# Patient Record
Sex: Female | Born: 1990 | Race: White | Hispanic: No | Marital: Married | Smoking: Former smoker
Health system: Southern US, Community
[De-identification: ages and names within clinical notes are randomized; demographics above are authoritative.]

## PROBLEM LIST (undated history)

## (undated) ENCOUNTER — Emergency Department (HOSPITAL_COMMUNITY): Discharge status: Against Medical Advice | Payer: PRIVATE HEALTH INSURANCE

## (undated) DIAGNOSIS — E282 Polycystic ovarian syndrome: Principal | ICD-10-CM

## (undated) DIAGNOSIS — R1032 Left lower quadrant pain: Principal | ICD-10-CM

## (undated) DIAGNOSIS — N912 Amenorrhea, unspecified: Principal | ICD-10-CM

## (undated) DIAGNOSIS — J45909 Unspecified asthma, uncomplicated: Secondary | ICD-10-CM

## (undated) DIAGNOSIS — Z319 Encounter for procreative management, unspecified: Principal | ICD-10-CM

## (undated) HISTORY — DX: Left lower quadrant pain: R10.32

## (undated) HISTORY — DX: Encounter for procreative management, unspecified: Z31.9

## (undated) HISTORY — PX: NO PAST SURGERIES: SHX2092

## (undated) HISTORY — DX: Unspecified asthma, uncomplicated: J45.909

## (undated) HISTORY — DX: Amenorrhea, unspecified: N91.2

## (undated) HISTORY — DX: Polycystic ovarian syndrome: E28.2

---
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
## 2011-08-28 ENCOUNTER — Emergency Department: Payer: Self-pay | Admitting: Emergency Medicine

## 2012-08-17 ENCOUNTER — Emergency Department (HOSPITAL_COMMUNITY)
Admission: EM | Admit: 2012-08-17 | Discharge: 2012-08-17 | Disposition: A | Discharge status: Home / Self Care | Payer: Self-pay | Attending: Emergency Medicine | Admitting: Emergency Medicine

## 2012-08-17 ENCOUNTER — Emergency Department (HOSPITAL_COMMUNITY): Payer: Self-pay

## 2012-08-17 ENCOUNTER — Encounter (HOSPITAL_COMMUNITY): Payer: Self-pay | Admitting: Emergency Medicine

## 2012-08-17 DIAGNOSIS — R109 Unspecified abdominal pain: Secondary | ICD-10-CM | POA: Insufficient documentation

## 2012-08-17 DIAGNOSIS — R05 Cough: Secondary | ICD-10-CM | POA: Insufficient documentation

## 2012-08-17 DIAGNOSIS — F17200 Nicotine dependence, unspecified, uncomplicated: Secondary | ICD-10-CM | POA: Insufficient documentation

## 2012-08-17 DIAGNOSIS — R079 Chest pain, unspecified: Secondary | ICD-10-CM | POA: Insufficient documentation

## 2012-08-17 DIAGNOSIS — R042 Hemoptysis: Secondary | ICD-10-CM | POA: Insufficient documentation

## 2012-08-17 DIAGNOSIS — R059 Cough, unspecified: Secondary | ICD-10-CM | POA: Insufficient documentation

## 2012-08-17 DIAGNOSIS — J40 Bronchitis, not specified as acute or chronic: Secondary | ICD-10-CM

## 2012-08-17 DIAGNOSIS — R062 Wheezing: Secondary | ICD-10-CM | POA: Insufficient documentation

## 2012-08-17 MED ORDER — IPRATROPIUM BROMIDE 0.02 % IN SOLN
0.5000 mg | Freq: Once | RESPIRATORY_TRACT | Status: AC
  Administered 2012-08-17: 0.5 mg via RESPIRATORY_TRACT
  Filled 2012-08-17: qty 2.5

## 2012-08-17 MED ORDER — ALBUTEROL SULFATE (5 MG/ML) 0.5% IN NEBU
2.5000 mg | INHALATION_SOLUTION | Freq: Once | RESPIRATORY_TRACT | Status: AC
  Administered 2012-08-17: 2.5 mg via RESPIRATORY_TRACT
  Filled 2012-08-17: qty 0.5

## 2012-08-17 MED ORDER — HYDROCOD POLST-CHLORPHEN POLST 10-8 MG/5ML PO LQCR: 5.0000 mL | Freq: Two times a day (BID) | ORAL | Status: DC | PRN

## 2012-08-17 MED ORDER — PREDNISONE 50 MG PO TABS
60.0000 mg | ORAL_TABLET | Freq: Once | ORAL | Status: AC
  Administered 2012-08-17: 60 mg via ORAL
  Filled 2012-08-17: qty 1

## 2012-08-17 MED ORDER — AMOXICILLIN 500 MG PO CAPS: 500.0000 mg | ORAL_CAPSULE | Freq: Three times a day (TID) | ORAL | Status: DC

## 2012-08-17 NOTE — ED Notes (Signed)
Pt c/o coughing up blood, chest pain and wheezing x one week.

## 2012-08-17 NOTE — ED Notes (Signed)
Pt discharged. Pt stable at time of discharge. Medications reviewed pt has no questions regarding discharge at this time. Pt voiced understanding of discharge instructions.  

## 2012-08-17 NOTE — ED Provider Notes (Signed)
History   This chart was scribed for Krystal Lennert, MD, by Frederik Pear, ER scribe. The patient was seen in room APA04/APA04 and the patient's care was started at 1715.    CSN: [id number]  Arrival date & time 08/17/12  1701   First MD Initiated Contact with Patient 08/17/12 1715      Chief Complaint  Patient presents with  . Cough  . Chest Pain  . Wheezing  . Hemoptysis    (Consider location/radiation/quality/duration/timing/severity/associated sxs/prior treatment) HPI Comments: Krystal Porter is a 21 y.o. female who presents to the Emergency Department complaining of moderate, constant coughing with associated wheezing and chest and abdominal pain that began a week ago. She states that she has coughed up bloody sputum twice. She has a h/o of childhood asthma.     Patient is a 21 y.o. female presenting with cough. The history is provided by the patient.  Cough This is a new problem. The current episode started more than 2 days ago. The problem occurs constantly. The problem has not changed since onset.The cough is productive of bloody sputum. She is not a smoker.    History reviewed. No pertinent past medical history.  History reviewed. No pertinent past surgical history.  History reviewed. No pertinent family history.  History  Substance Use Topics  . Smoking status: Current Some Day Smoker  . Smokeless tobacco: Not on file  . Alcohol Use: No    OB History    Grav Para Term Preterm Abortions TAB SAB Ect Mult Living                  Review of Systems  HENT: Negative for congestion, sinus pressure and ear discharge.   Eyes: Negative for discharge.  Gastrointestinal: Negative for diarrhea.  Genitourinary: Negative for frequency and hematuria.  Musculoskeletal: Negative for back pain.  Skin: Negative for rash.  Hematological: Negative.   Psychiatric/Behavioral: Negative for hallucinations.  All other systems reviewed and are negative.    Allergies  Review  of patient's allergies indicates no known allergies.  Home Medications  No current outpatient prescriptions on file.  BP 125/85  Pulse 118  Temp 98.4 F (36.9 C) (Oral)  Resp 20  Ht 5\' 3"  (1.6 m)  Wt 180 lb (81.647 kg)  BMI 31.89 kg/m2  SpO2 98%  LMP 07/16/2012  Physical Exam  Constitutional: She is oriented to person, place, and time. She appears well-developed.  HENT:  Head: Normocephalic and atraumatic.  Eyes: Conjunctivae normal and EOM are normal. No scleral icterus.  Neck: Neck supple. No thyromegaly present.  Cardiovascular: Normal rate and regular rhythm.  Exam reveals no gallop and no friction rub.   No murmur heard. Pulmonary/Chest: No stridor. She has wheezes. She has no rales. She exhibits no tenderness.       She has moderate wheezing bilaterally.  Abdominal: She exhibits no distension. There is no tenderness. There is no rebound.  Musculoskeletal: Normal range of motion. She exhibits no edema.  Lymphadenopathy:    She has no cervical adenopathy.  Neurological: She is oriented to person, place, and time. Coordination normal.  Skin: No rash noted. No erythema.  Psychiatric: She has a normal mood and affect. Her behavior is normal.    ED Course  Procedures (including critical care time)  DIAGNOSTIC STUDIES: Oxygen Saturation is 98% on room air, normal by my interpretation.    COORDINATION OF CARE:  17:18- Discussed planned course of treatment with the patient, including a breathing treatment and  chest X-ray, who is agreeable at this time.  17:30- albuterol (PROVENTIL) (5MG /ML) 0.5% nebulizer solution 2.5 mg-Once, ipratropium (ATROVENT) nebulizer solution 0.5 mg-Once, prednisone (DELTASONE) tablet 60 mg-Once.  18:56- Recheck- Her lungs sound better after the breathing treatment. Discussed that her symptoms are consistent with bronchitis and starting her on a course of antibiotics.  Labs Reviewed - No data to display Dg Chest 2 View  08/17/2012  *RADIOLOGY  REPORT*  Clinical Data: Cough.  Chest pain.  Wheezing.  CHEST - 2 VIEW  Comparison: None.  Findings:  Cardiopericardial silhouette within normal limits. Mediastinal contours normal. Trachea midline.  No airspace disease or effusion.  IMPRESSION: Negative two-view chest.   Original Report Authenticated By: Andreas Newport, M.D.      No diagnosis found.    MDM    The chart was scribed for me under my direct supervision.  I personally performed the history, physical, and medical decision making and all procedures in the evaluation of this patient.Krystal Lennert, MD 08/17/12 [phone]

## 2012-08-17 NOTE — ED Notes (Signed)
Paged RT for treatment

## 2012-10-10 ENCOUNTER — Emergency Department (HOSPITAL_COMMUNITY): Payer: Self-pay

## 2012-10-10 ENCOUNTER — Encounter (HOSPITAL_COMMUNITY): Payer: Self-pay | Admitting: Emergency Medicine

## 2012-10-10 ENCOUNTER — Emergency Department (HOSPITAL_COMMUNITY)
Admission: EM | Admit: 2012-10-10 | Discharge: 2012-10-10 | Disposition: A | Discharge status: Home / Self Care | Payer: Self-pay | Attending: Emergency Medicine | Admitting: Emergency Medicine

## 2012-10-10 DIAGNOSIS — F17200 Nicotine dependence, unspecified, uncomplicated: Secondary | ICD-10-CM | POA: Insufficient documentation

## 2012-10-10 DIAGNOSIS — R109 Unspecified abdominal pain: Secondary | ICD-10-CM | POA: Insufficient documentation

## 2012-10-10 LAB — URINALYSIS, ROUTINE W REFLEX MICROSCOPIC
Bilirubin Urine: NEGATIVE
Glucose, UA: NEGATIVE mg/dL
Ketones, ur: NEGATIVE mg/dL
Nitrite: NEGATIVE
Specific Gravity, Urine: >1.03 — ABNORMAL HIGH (ref 1.005–1.030)
pH: 5 (ref 5.0–8.0)

## 2012-10-10 LAB — COMPREHENSIVE METABOLIC PANEL
ALT: 35 U/L (ref 0–35)
AST: 25 U/L (ref 0–37)
Albumin: 4.2 g/dL (ref 3.5–5.2)
Alkaline Phosphatase: 83 U/L (ref 39–117)
Calcium: 9.9 mg/dL (ref 8.4–10.5)
Potassium: 3.8 mEq/L (ref 3.5–5.1)
Sodium: 136 mEq/L (ref 135–145)
Total Protein: 8.4 g/dL — ABNORMAL HIGH (ref 6.0–8.3)

## 2012-10-10 LAB — CBC WITH DIFFERENTIAL/PLATELET
Basophils Absolute: 0 10*3/uL (ref 0.0–0.1)
Basophils Relative: 0 % (ref 0–1)
Eosinophils Absolute: 0.1 10*3/uL (ref 0.0–0.7)
Eosinophils Relative: 1 % (ref 0–5)
Lymphs Abs: 1.5 10*3/uL (ref 0.7–4.0)
MCH: 28.9 pg (ref 26.0–34.0)
Neutrophils Relative %: 78 % — ABNORMAL HIGH (ref 43–77)
Platelets: 301 10*3/uL (ref 150–400)
RBC: 4.88 MIL/uL (ref 3.87–5.11)
RDW: 13 % (ref 11.5–15.5)

## 2012-10-10 LAB — WET PREP, GENITAL
Clue Cells Wet Prep HPF POC: NONE SEEN
Trich, Wet Prep: NONE SEEN
WBC, Wet Prep HPF POC: NONE SEEN

## 2012-10-10 LAB — PREGNANCY, URINE: Preg Test, Ur: NEGATIVE

## 2012-10-10 MED ORDER — AZITHROMYCIN 250 MG PO TABS
1000.0000 mg | ORAL_TABLET | Freq: Once | ORAL | Status: AC
  Administered 2012-10-10: 1000 mg via ORAL
  Filled 2012-10-10: qty 4

## 2012-10-10 MED ORDER — CEFTRIAXONE SODIUM 1 G IJ SOLR
1.0000 g | Freq: Once | INTRAMUSCULAR | Status: AC
  Administered 2012-10-10: 1 g via INTRAMUSCULAR
  Filled 2012-10-10: qty 10

## 2012-10-10 MED ORDER — HYDROMORPHONE HCL PF 1 MG/ML IJ SOLN
1.0000 mg | Freq: Once | INTRAMUSCULAR | Status: AC
  Administered 2012-10-10: 1 mg via INTRAVENOUS
  Filled 2012-10-10: qty 1

## 2012-10-10 MED ORDER — SODIUM CHLORIDE 0.9 % IV BOLUS (SEPSIS)
1000.0000 mL | Freq: Once | INTRAVENOUS | Status: AC
  Administered 2012-10-10: 1000 mL via INTRAVENOUS

## 2012-10-10 MED ORDER — LIDOCAINE HCL (PF) 1 % IJ SOLN
INTRAMUSCULAR | Status: AC
  Administered 2012-10-10: 2.1 mL
  Filled 2012-10-10: qty 5

## 2012-10-10 MED ORDER — DEXTROSE 5 % IV SOLN: 1.0000 g | Freq: Once | INTRAVENOUS | Status: DC

## 2012-10-10 MED ORDER — ONDANSETRON HCL 4 MG/2ML IJ SOLN
4.0000 mg | Freq: Once | INTRAMUSCULAR | Status: AC
  Administered 2012-10-10: 4 mg via INTRAVENOUS
  Filled 2012-10-10: qty 2

## 2012-10-10 MED ORDER — IOHEXOL 300 MG/ML  SOLN
100.0000 mL | Freq: Once | INTRAMUSCULAR | Status: AC | PRN
  Administered 2012-10-10: 100 mL via INTRAVENOUS

## 2012-10-10 MED ORDER — ONDANSETRON 8 MG PO TBDP
8.0000 mg | ORAL_TABLET | Freq: Once | ORAL | Status: AC
  Administered 2012-10-10: 8 mg via ORAL
  Filled 2012-10-10: qty 1

## 2012-10-10 NOTE — ED Notes (Signed)
Pt c/o abdominal pain and nausea x4 days. Pt states pain began in upper quadrants but now radiates to lower. Pain has gradually worsened. Denies V/D.

## 2012-10-10 NOTE — ED Provider Notes (Signed)
History     CSN: [id number]  Arrival date & time 10/10/12  1408   First MD Initiated Contact with Patient 10/10/12 1742      Chief Complaint  Patient presents with  . Abdominal Pain    (Consider location/radiation/quality/duration/timing/severity/associated sxs/prior treatment) HPI Patient complaining of pain in lower abdomen with pressure that began four days ago, comes and goes present all day today.  Pain is 9.5, did not treat at home. Nausea but no vomiting, took water only today.  Normal bowel movements, no uti symtpoms, no abnormal vaginal discharge.  LMP 8 months ago.  G0, patient is sexually active.  Patient had oral contraceptives from health department, states she took oral contraceptives for several months but quit because she had several clots but only for one day.   History reviewed. No pertinent past medical history.  History reviewed. No pertinent past surgical history.  History reviewed. No pertinent family history.  History  Substance Use Topics  . Smoking status: Current Some Day Smoker  . Smokeless tobacco: Not on file  . Alcohol Use: No    OB History    Grav Para Term Preterm Abortions TAB SAB Ect Mult Living                  Review of Systems  All other systems reviewed and are negative.    Allergies  Review of patient's allergies indicates no known allergies.  Home Medications   Current Outpatient Rx  Name  Route  Sig  Dispense  Refill  . AMOXICILLIN 500 MG PO CAPS   Oral   Take 1 capsule (500 mg total) by mouth 3 (three) times daily.   21 capsule   0   . HYDROCOD POLST-CPM POLST ER 10-8 MG/5ML PO LQCR   Oral   Take 5 mLs by mouth every 12 (twelve) hours as needed.   115 mL   0     BP 126/84  Pulse 114  Temp 98 F (36.7 C) (Oral)  Resp 18  SpO2 98%  Physical Exam  Nursing note and vitals reviewed. Constitutional: She appears well-developed and well-nourished.  HENT:  Head: Normocephalic and atraumatic.  Eyes: Conjunctivae  normal and EOM are normal. Pupils are equal, round, and reactive to light.  Neck: Normal range of motion. Neck supple.  Cardiovascular: Normal rate, regular rhythm, normal heart sounds and intact distal pulses.   Pulmonary/Chest: Effort normal and breath sounds normal.  Abdominal: Soft. Bowel sounds are normal. There is tenderness.       Diffuse abdominal tenderness  Genitourinary: Uterus normal. Cervix exhibits motion tenderness. Cervix exhibits no discharge and no friability. Right adnexum displays tenderness. Right adnexum displays no mass. Left adnexum displays tenderness. Left adnexum displays no mass. No vaginal discharge found.  Musculoskeletal: Normal range of motion.  Neurological: She is alert. She has normal reflexes.  Skin: Skin is warm and dry.  Psychiatric: She has a normal mood and affect. Her behavior is normal. Judgment and thought content normal.    ED Course  Procedures (including critical care time)  Labs Reviewed - No data to display No results found.   No diagnosis found.  Results for orders placed during the hospital encounter of 10/10/12  CBC WITH DIFFERENTIAL      Component Value Range   WBC 9.6  4.0 - 10.5 K/uL   RBC 4.88  3.87 - 5.11 MIL/uL   Hemoglobin 14.1  12.0 - 15.0 g/dL   HCT 40.9  81.1 - 91.4 %  MCV 86.7  78.0 - 100.0 fL   MCH 28.9  26.0 - 34.0 pg   MCHC 33.3  30.0 - 36.0 g/dL   RDW 16.1  09.6 - 04.5 %   Platelets 301  150 - 400 K/uL   Neutrophils Relative 78 (*) 43 - 77 %   Neutro Abs 7.5  1.7 - 7.7 K/uL   Lymphocytes Relative 16  12 - 46 %   Lymphs Abs 1.5  0.7 - 4.0 K/uL   Monocytes Relative 5  3 - 12 %   Monocytes Absolute 0.5  0.1 - 1.0 K/uL   Eosinophils Relative 1  0 - 5 %   Eosinophils Absolute 0.1  0.0 - 0.7 K/uL   Basophils Relative 0  0 - 1 %   Basophils Absolute 0.0  0.0 - 0.1 K/uL  COMPREHENSIVE METABOLIC PANEL      Component Value Range   Sodium 136  135 - 145 mEq/L   Potassium 3.8  3.5 - 5.1 mEq/L   Chloride 99  96 -  112 mEq/L   CO2 26  19 - 32 mEq/L   Glucose, Bld 91  70 - 99 mg/dL   BUN 9  6 - 23 mg/dL   Creatinine, Ser 4.09  0.50 - 1.10 mg/dL   Calcium 9.9  8.4 - 81.1 mg/dL   Total Protein 8.4 (*) 6.0 - 8.3 g/dL   Albumin 4.2  3.5 - 5.2 g/dL   AST 25  0 - 37 U/L   ALT 35  0 - 35 U/L   Alkaline Phosphatase 83  39 - 117 U/L   Total Bilirubin 0.3  0.3 - 1.2 mg/dL   GFR calc non Af Amer >90  >90 mL/min   GFR calc Af Amer >90  >90 mL/min  URINALYSIS, ROUTINE W REFLEX MICROSCOPIC      Component Value Range   Color, Urine YELLOW  YELLOW   APPearance CLEAR  CLEAR   Specific Gravity, Urine >1.030 (*) 1.005 - 1.030   pH 5.0  5.0 - 8.0   Glucose, UA NEGATIVE  NEGATIVE mg/dL   Hgb urine dipstick NEGATIVE  NEGATIVE   Bilirubin Urine NEGATIVE  NEGATIVE   Ketones, ur NEGATIVE  NEGATIVE mg/dL   Protein, ur NEGATIVE  NEGATIVE mg/dL   Urobilinogen, UA 0.2  0.0 - 1.0 mg/dL   Nitrite NEGATIVE  NEGATIVE   Leukocytes, UA NEGATIVE  NEGATIVE  WET PREP, GENITAL      Component Value Range   Yeast Wet Prep HPF POC NONE SEEN  NONE SEEN   Trich, Wet Prep NONE SEEN  NONE SEEN   Clue Cells Wet Prep HPF POC NONE SEEN  NONE SEEN   WBC, Wet Prep HPF POC NONE SEEN  NONE SEEN  PREGNANCY, URINE      Component Value Range   Preg Test, Ur NEGATIVE  NEGATIVE     MDM  Patient treated empirically for pelvic infection due to cmt.  Pregnancy negative, urine clear, labs normal and ct abdomen without acute abnormality.         Hilario Quarry, MD 10/10/12 2000

## 2012-10-11 LAB — GC/CHLAMYDIA PROBE AMP: GC Probe RNA: NEGATIVE

## 2013-02-10 ENCOUNTER — Encounter (HOSPITAL_COMMUNITY): Payer: Self-pay | Admitting: *Deleted

## 2013-02-10 ENCOUNTER — Emergency Department (HOSPITAL_COMMUNITY)
Admission: EM | Admit: 2013-02-10 | Discharge: 2013-02-10 | Disposition: A | Discharge status: Home / Self Care | Payer: PRIVATE HEALTH INSURANCE | Attending: Emergency Medicine | Admitting: Emergency Medicine

## 2013-02-10 DIAGNOSIS — R1032 Left lower quadrant pain: Secondary | ICD-10-CM | POA: Insufficient documentation

## 2013-02-10 DIAGNOSIS — R109 Unspecified abdominal pain: Secondary | ICD-10-CM

## 2013-02-10 DIAGNOSIS — F17200 Nicotine dependence, unspecified, uncomplicated: Secondary | ICD-10-CM | POA: Insufficient documentation

## 2013-02-10 DIAGNOSIS — Z3202 Encounter for pregnancy test, result negative: Secondary | ICD-10-CM | POA: Insufficient documentation

## 2013-02-10 LAB — URINALYSIS, ROUTINE W REFLEX MICROSCOPIC
Bilirubin Urine: NEGATIVE
Hgb urine dipstick: NEGATIVE
Ketones, ur: NEGATIVE mg/dL
Nitrite: NEGATIVE
Specific Gravity, Urine: 1.025 (ref 1.005–1.030)
Urobilinogen, UA: 0.2 mg/dL (ref 0.0–1.0)
pH: 7 (ref 5.0–8.0)

## 2013-02-10 NOTE — ED Notes (Signed)
Pt c/o left lower abdominal pain. Pt states she is having a burning pain. She thinks its her ovaries. Intermittent for a while.

## 2013-02-10 NOTE — ED Provider Notes (Signed)
History     CSN: [id number]  Arrival date & time 02/10/13  2033   First MD Initiated Contact with Patient 02/10/13 2044      Chief Complaint  Patient presents with  . Abdominal Pain    (Consider location/radiation/quality/duration/timing/severity/associated sxs/prior treatment) Patient is a 22 y.o. female presenting with abdominal pain. The history is provided by the patient (the pt complains of abd pain for months.  recent std neg). No language interpreter was used.  Abdominal Pain Pain location:  LLQ Pain quality: aching   Pain radiates to:  Does not radiate Pain severity:  Mild Onset quality:  Gradual Timing:  Intermittent Associated symptoms: no chest pain, no cough, no diarrhea, no fatigue and no hematuria     History reviewed. No pertinent past medical history.  History reviewed. No pertinent past surgical history.  History reviewed. No pertinent family history.  History  Substance Use Topics  . Smoking status: Current Some Day Smoker  . Smokeless tobacco: Not on file  . Alcohol Use: No    OB History   Grav Para Term Preterm Abortions TAB SAB Ect Mult Living                  Review of Systems  Constitutional: Negative for appetite change and fatigue.  HENT: Negative for congestion, sinus pressure and ear discharge.   Eyes: Negative for discharge.  Respiratory: Negative for cough.   Cardiovascular: Negative for chest pain.  Gastrointestinal: Positive for abdominal pain. Negative for diarrhea.  Genitourinary: Negative for frequency and hematuria.  Musculoskeletal: Negative for back pain.  Skin: Negative for rash.  Neurological: Negative for seizures and headaches.  Psychiatric/Behavioral: Negative for hallucinations.    Allergies  Review of patient's allergies indicates no known allergies.  Home Medications  No current outpatient prescriptions on file.  BP 124/99  Pulse 112  Temp(Src) 98.6 F (37 C) (Oral)  Resp 18  Ht 5\' 2"  (1.575 m)  Wt 195  lb (88.451 kg)  BMI 35.66 kg/m2  SpO2 98%  Physical Exam  Constitutional: She is oriented to person, place, and time. She appears well-developed.  HENT:  Head: Normocephalic.  Eyes: Conjunctivae and EOM are normal. No scleral icterus.  Neck: Neck supple. No thyromegaly present.  Cardiovascular: Normal rate and regular rhythm.  Exam reveals no gallop and no friction rub.   No murmur heard. Pulmonary/Chest: No stridor. She has no wheezes. She has no rales. She exhibits no tenderness.  Abdominal: She exhibits no distension. There is tenderness. There is no rebound.  Minimal llq tender  Musculoskeletal: Normal range of motion. She exhibits no edema.  Lymphadenopathy:    She has no cervical adenopathy.  Neurological: She is oriented to person, place, and time. Coordination normal.  Skin: No rash noted. No erythema.  Psychiatric: She has a normal mood and affect. Her behavior is normal.    ED Course  Procedures (including critical care time)  Labs Reviewed  URINALYSIS, ROUTINE W REFLEX MICROSCOPIC  PREGNANCY, URINE   No results found.   1. Abdominal pain       MDM  Possible ovarian cyst.  Will refer to gyn       Benny Lennert, MD 02/10/13 2223

## 2013-02-10 NOTE — ED Notes (Signed)
Pt alert & oriented x4, stable gait. Patient given discharge instructions, paperwork & prescription(s). Patient  instructed to stop at the registration desk to finish any additional paperwork. Patient verbalized understanding. Pt left department w/ no further questions. 

## 2013-02-10 NOTE — ED Notes (Signed)
Reports LLQ pain x 3 months.  Pt states that she was on BC pills, but was taken off  d/t the burning.  Has been off BC pills x 3 months.  Pt reports that she does not have an OB-GYN.  Denies n/v/d.

## 2013-02-19 ENCOUNTER — Ambulatory Visit: Payer: Self-pay | Admitting: Adult Health

## 2013-03-13 ENCOUNTER — Ambulatory Visit (INDEPENDENT_AMBULATORY_CARE_PROVIDER_SITE_OTHER): Payer: PRIVATE HEALTH INSURANCE | Admitting: Adult Health

## 2013-03-13 ENCOUNTER — Encounter: Payer: Self-pay | Admitting: Adult Health

## 2013-03-13 VITALS — BP 108/84 | Ht 63.0 in | Wt 193.0 lb

## 2013-03-13 DIAGNOSIS — Z3202 Encounter for pregnancy test, result negative: Secondary | ICD-10-CM

## 2013-03-13 DIAGNOSIS — Z3200 Encounter for pregnancy test, result unknown: Secondary | ICD-10-CM

## 2013-03-13 DIAGNOSIS — N912 Amenorrhea, unspecified: Secondary | ICD-10-CM

## 2013-03-13 DIAGNOSIS — N949 Unspecified condition associated with female genital organs and menstrual cycle: Secondary | ICD-10-CM | POA: Insufficient documentation

## 2013-03-13 HISTORY — DX: Amenorrhea, unspecified: N91.2

## 2013-03-13 LAB — COMPREHENSIVE METABOLIC PANEL
Albumin: 4.3 g/dL (ref 3.5–5.2)
BUN: 10 mg/dL (ref 6–23)
Calcium: 10.2 mg/dL (ref 8.4–10.5)
Chloride: 103 mEq/L (ref 96–112)
Creat: 0.71 mg/dL (ref 0.50–1.10)
Glucose, Bld: 92 mg/dL (ref 70–99)
Potassium: 5.2 mEq/L (ref 3.5–5.3)

## 2013-03-13 LAB — CBC
HCT: 41.8 % (ref 36.0–46.0)
Hemoglobin: 14.1 g/dL (ref 12.0–15.0)
MCV: 82.8 fL (ref 78.0–100.0)
WBC: 11.4 10*3/uL — ABNORMAL HIGH (ref 4.0–10.5)

## 2013-03-13 LAB — POCT URINE PREGNANCY: Preg Test, Ur: NEGATIVE

## 2013-03-13 NOTE — Progress Notes (Signed)
Subjective:     Patient ID: Krystal Porter, female   DOB: Nov 22, 1990, 22 y.o.   MRN: [id number]  HPI Krystal Porter is a 22 year old white female in complaining of no period in 6 months, she has been off the pill that long, she has pain across low pelvic region that comes and goes.She desires pregnancy.She has been seen in there ER in the past.  Review of Systems Positives as in HPI  Reviewed past medical,surgical, social and family history. Reviewed medications and allergies.     Objective:   Physical Exam BP 108/84  Ht 5\' 3"  (1.6 m)  Wt 193 lb (87.544 kg)  BMI 34.2 kg/m2   Urine pregnancy test negative.Talk only today. Assessment:      amenorrhea Pelvic pain Desires pregnancy     Plan:      Check CBC,CMP and TSH      If tests normal will try withdrawal with provera Pelvic US in 2 weeks and see me Take prenatal vitamin with folic acid

## 2013-03-13 NOTE — Patient Instructions (Addendum)
Follow up in 2 weeks for [place] Take prenatal vits with folic acid

## 2013-03-14 LAB — TSH: TSH: 3.332 u[IU]/mL (ref 0.350–4.500)

## 2013-03-16 ENCOUNTER — Telehealth: Payer: Self-pay | Admitting: Adult Health

## 2013-03-16 NOTE — Telephone Encounter (Signed)
Spoke with pt, pt aware of lab results. Wants JAG to look over and make sure everything is ok. Also wanted to let JAG know she has had tremendous wt gain, 60+ pounds in less than 6 months.

## 2013-03-16 NOTE — Telephone Encounter (Signed)
Pt aware of labs keep [place] appt next week.

## 2013-03-27 ENCOUNTER — Encounter: Payer: Self-pay | Admitting: Adult Health

## 2013-03-27 ENCOUNTER — Ambulatory Visit (INDEPENDENT_AMBULATORY_CARE_PROVIDER_SITE_OTHER): Payer: PRIVATE HEALTH INSURANCE | Admitting: Adult Health

## 2013-03-27 ENCOUNTER — Ambulatory Visit (INDEPENDENT_AMBULATORY_CARE_PROVIDER_SITE_OTHER): Payer: PRIVATE HEALTH INSURANCE

## 2013-03-27 VITALS — BP 122/70 | Ht 61.0 in | Wt 195.0 lb

## 2013-03-27 DIAGNOSIS — Z3202 Encounter for pregnancy test, result negative: Secondary | ICD-10-CM

## 2013-03-27 DIAGNOSIS — N949 Unspecified condition associated with female genital organs and menstrual cycle: Secondary | ICD-10-CM

## 2013-03-27 DIAGNOSIS — E282 Polycystic ovarian syndrome: Secondary | ICD-10-CM | POA: Insufficient documentation

## 2013-03-27 DIAGNOSIS — N912 Amenorrhea, unspecified: Secondary | ICD-10-CM

## 2013-03-27 HISTORY — DX: Polycystic ovarian syndrome: E28.2

## 2013-03-27 LAB — POCT URINE PREGNANCY: Preg Test, Ur: NEGATIVE

## 2013-03-27 MED ORDER — NORGESTIMATE-ETH ESTRADIOL 0.25-35 MG-MCG PO TABS: 1.0000 | ORAL_TABLET | Freq: Every day | ORAL | Status: DC

## 2013-03-27 MED ORDER — MEDROXYPROGESTERONE ACETATE 10 MG PO TABS: 10.0000 mg | ORAL_TABLET | Freq: Every day | ORAL | Status: DC

## 2013-03-27 NOTE — Progress Notes (Signed)
Subjective:     Patient ID: Krystal Porter, female   DOB: 1990/12/26, 22 y.o.   MRN: [id number]  HPI Krystal Porter is in for a pelvic US, she has not had a period in 6 months and TSH was normal, US showed polycystic characteristics of the ovaries bilaterally.  Review of Systems No complaints today, positives in HPI Reviewed past medical,surgical, social and family history. Reviewed medications and allergies.     Objective:   Physical Exam BP 122/70  Ht 5\' 1"  (1.549 m)  Wt 195 lb (88.451 kg)  BMI 36.86 kg/m2Urine pregnancy test negative. Reviewed [place] with pt and her partner.Discussed with Dr Despina Hidden   And discussed plan of treatment. Assessment:      Amenorrhea PCO    Plan:      Rx provera 10 mg 1 daily x 10 days, start today Rx sprintec, will cycle with sprintec, after the provera, til she is ready to try to get pregnant, and she is aware that she may need clomid   Follow up in 2 weeks Review handout on PCO Continue prenatal vitamins

## 2013-03-27 NOTE — Patient Instructions (Addendum)
Polycystic Ovarian Syndrome Polycystic ovarian syndrome is a condition with a number of problems. One problem is with the ovaries. The ovaries are organs located in the female pelvis, on each side of the uterus. Usually, during the menstrual cycle, an egg is released from 1 ovary every month. This is called ovulation. When the egg is fertilized, it goes into the womb (uterus), which allows for the growth of a baby. The egg travels from the ovary through the fallopian tube to the uterus. The ovaries also make the hormones estrogen and progesterone. These hormones help the development of a woman's breasts, body shape, and body hair. They also regulate the menstrual cycle and pregnancy. Sometimes, cysts form in the ovaries. A cyst is a fluid-filled sac. On the ovary, different types of cysts can form. The most common type of ovarian cyst is called a functional or ovulation cyst. It is normal, and often forms during the normal menstrual cycle. Each month, a woman's ovaries grow tiny cysts that hold the eggs. When an egg is fully grown, the sac breaks open. This releases the egg. Then, the sac which released the egg from the ovary dissolves. In one type of functional cyst, called a follicle cyst, the sac does not break open to release the egg. It may actually continue to grow. This type of cyst usually disappears within 1 to 3 months.  One type of cyst problem with the ovaries is called Polycystic Ovarian Syndrome (PCOS). In this condition, many follicle cysts form, but do not rupture and produce an egg. This health problem can affect the following:  Menstrual cycle.  Heart.  Obesity.  Cancer of the uterus.  Fertility.  Blood vessels.  Hair growth (face and body) or baldness.  Hormones.  Appearance.  High blood pressure.  Stroke.  Insulin production.  Inflammation of the liver.  Elevated blood cholesterol and triglycerides. CAUSES   No one knows the exact cause of PCOS.  Women with  PCOS often have a mother or sister with PCOS. There is not yet enough proof to say this is inherited.  Many women with PCOS have a weight problem.  Researchers are looking at the relationship between PCOS and the body's ability to make insulin. Insulin is a hormone that regulates the change of sugar, starches, and other food into energy for the body's use, or for storage. Some women with PCOS make too much insulin. It is possible that the ovaries react by making too many female hormones, called androgens. This can lead to acne, excessive hair growth, weight gain, and ovulation problems.  Too much production of luteinizing hormone (LH) from the pituitary gland in the brain stimulates the ovary to produce too much female hormone (androgen). SYMPTOMS   Infrequent or no menstrual periods, and/or irregular bleeding.  Inability to get pregnant (infertility), because of not ovulating.  Increased growth of hair on the face, chest, stomach, back, thumbs, thighs, or toes.  Acne, oily skin, or dandruff.  Pelvic pain.  Weight gain or obesity, usually carrying extra weight around the waist.  Type 2 diabetes (this is the diabetes that usually does not need insulin).  High cholesterol.  High blood pressure.  Female-pattern baldness or thinning hair.  Patches of thickened and dark brown or black skin on the neck, arms, breasts, or thighs.  Skin tags, or tiny excess flaps of skin, in the armpits or neck area.  Sleep apnea (excessive snoring and breathing stops at times while asleep).  Deepening of the voice.  Gestational diabetes when pregnant.  Increased risk of miscarriage with pregnancy. DIAGNOSIS  There is no single test to diagnose PCOS.   Your caregiver will:  Take a medical history.  Perform a pelvic exam.  Perform an ultrasound.  Check your female and female hormone levels.  Measure glucose or sugar levels in the blood.  Do other blood tests.  If you are producing too many  female hormones, your caregiver will make sure it is from PCOS. At the physical exam, your caregiver will want to evaluate the areas of increased hair growth. Try to allow natural hair growth for a few days before the visit.  During a pelvic exam, the ovaries may be enlarged or swollen by the increased number of small cysts. This can be seen more easily by vaginal ultrasound or screening, to examine the ovaries and lining of the uterus (endometrium) for cysts. The uterine lining may become thicker, if there has not been a regular period. TREATMENT  Because there is no cure for PCOS, it needs to be managed to prevent problems. Treatments are based on your symptoms. Treatment is also based on whether you want to have a baby or whether you need contraception.  Treatment may include:  Progesterone hormone, to start a menstrual period.  Birth control pills, to make you have regular menstrual periods.  Medicines to make you ovulate, if you want to get pregnant.  Medicines to control your insulin.  Medicine to control your blood pressure.  Medicine and diet, to control your high cholesterol and triglycerides in your blood.  Surgery, making small holes in the ovary, to decrease the amount of female hormone production. This is done through a long, lighted tube (laparoscope), placed into the pelvis through a tiny incision in the lower abdomen. Your caregiver will go over some of the choices with you. WOMEN WITH PCOS HAVE THESE CHARACTERISTICS:  High levels of female hormones called androgens.  An irregular or no menstrual cycle.  May have many small cysts in their ovaries. PCOS is the most common hormonal reproductive problem in women of childbearing age. WHY DO WOMEN WITH PCOS HAVE TROUBLE WITH THEIR MENSTRUAL CYCLE? Each month, about 20 eggs start to mature in the ovaries. As one egg grows and matures, the follicle breaks open to release the egg, so it can travel through the fallopian tube for  fertilization. When the single egg leaves the follicle, ovulation takes place. In women with PCOS, the ovary does not make all of the hormones it needs for any of the eggs to fully mature. They may start to grow and accumulate fluid, but no one egg becomes large enough. Instead, some may remain as cysts. Since no egg matures or is released, ovulation does not occur and the hormone progesterone is not made. Without progesterone, a woman's menstrual cycle is irregular or absent. Also, the cysts produce female hormones, which continue to prevent ovulation.  Document Released: 01/11/2005 Document Revised: 12/10/2011 Document Reviewed: 08/05/2009 [facility] Patient Information 2014 [place], [place]. Start provera 10 mg 1 daily x 10 days Rx sprintec to begin after menses Return in 2 weeks in follow up

## 2013-04-06 ENCOUNTER — Telehealth: Payer: Self-pay | Admitting: Adult Health

## 2013-04-06 NOTE — Telephone Encounter (Signed)
Pt states was started on Provera, finished her last pill yesterday and has still not started her period. Pt states has RX BCP unsure when Cyril Mourning, NP wants her to start them.

## 2013-04-06 NOTE — Telephone Encounter (Signed)
Give 3-4 more days and let me know if you start keep appt next week

## 2013-04-13 ENCOUNTER — Ambulatory Visit: Payer: PRIVATE HEALTH INSURANCE | Admitting: Women's Health

## 2013-04-13 ENCOUNTER — Ambulatory Visit (INDEPENDENT_AMBULATORY_CARE_PROVIDER_SITE_OTHER): Payer: PRIVATE HEALTH INSURANCE | Admitting: Adult Health

## 2013-04-13 ENCOUNTER — Encounter: Payer: Self-pay | Admitting: Adult Health

## 2013-04-13 VITALS — BP 110/82 | Ht 61.0 in | Wt 195.5 lb

## 2013-04-13 DIAGNOSIS — E282 Polycystic ovarian syndrome: Secondary | ICD-10-CM

## 2013-04-13 DIAGNOSIS — N912 Amenorrhea, unspecified: Secondary | ICD-10-CM

## 2013-04-13 NOTE — Progress Notes (Signed)
Subjective:     Patient ID: Krystal Porter, female   DOB: Feb 04, 1991, 22 y.o.   MRN: [id number]  HPI Angelo is back in follow up for PCOs and history of amenorrhea x 6 months, she finished the provera challenge and started her period 7/11 and will start sprintec today. She has a burning in her ovaries.  Review of Systems See HPI Reviewed past medical,surgical, social and family history. Reviewed medications and allergies.     Objective:   Physical Exam BP 110/82  Ht 5\' 1"  (1.549 m)  Wt 195 lb 8 oz (88.678 kg)  BMI 36.96 kg/m2  LMP 04/10/2013  Discussed starting sprintec now, keep period calendar and return in 3 months to see if periods are more regular.Will Rx clomid when ready to start trying to get pregnant.    Assessment:      PCO History of amenorrhea    Plan:      Start sprintec today Take prenatal vitamins Follow up in 3 months, call prn    Take motrin or tylenol

## 2013-04-13 NOTE — Patient Instructions (Addendum)
Start sprintec today and follow up in 3 months Keep period calendar

## 2013-05-06 ENCOUNTER — Telehealth: Payer: Self-pay | Admitting: Adult Health

## 2013-05-06 NOTE — Telephone Encounter (Signed)
Spoke with Krystal Porter. On Sprintec. Will take the 4th white pill today. Has not started period yet. Has been on the pill x 1 month. Spoke with JAG. Advised to do a home pregnancy test but don't stop pills. To let us know what results show. Krystal Porter voiced understanding. JSY

## 2013-05-07 ENCOUNTER — Telehealth: Payer: Self-pay | Admitting: Adult Health

## 2013-05-07 NOTE — Telephone Encounter (Signed)
Has been on sprintec but took pregnancy test that did not show + or - to retake in am if negative continue OCs

## 2013-06-02 ENCOUNTER — Emergency Department (HOSPITAL_COMMUNITY)
Admission: EM | Admit: 2013-06-02 | Discharge: 2013-06-02 | Disposition: A | Discharge status: Home / Self Care | Payer: PRIVATE HEALTH INSURANCE | Attending: Emergency Medicine | Admitting: Emergency Medicine

## 2013-06-02 ENCOUNTER — Encounter (HOSPITAL_COMMUNITY): Payer: Self-pay | Admitting: *Deleted

## 2013-06-02 ENCOUNTER — Emergency Department (HOSPITAL_COMMUNITY): Payer: PRIVATE HEALTH INSURANCE

## 2013-06-02 DIAGNOSIS — J45909 Unspecified asthma, uncomplicated: Secondary | ICD-10-CM | POA: Insufficient documentation

## 2013-06-02 DIAGNOSIS — S93401A Sprain of unspecified ligament of right ankle, initial encounter: Secondary | ICD-10-CM

## 2013-06-02 DIAGNOSIS — X500XXA Overexertion from strenuous movement or load, initial encounter: Secondary | ICD-10-CM | POA: Insufficient documentation

## 2013-06-02 DIAGNOSIS — Y9389 Activity, other specified: Secondary | ICD-10-CM | POA: Insufficient documentation

## 2013-06-02 DIAGNOSIS — S93409A Sprain of unspecified ligament of unspecified ankle, initial encounter: Secondary | ICD-10-CM | POA: Insufficient documentation

## 2013-06-02 DIAGNOSIS — Z8742 Personal history of other diseases of the female genital tract: Secondary | ICD-10-CM | POA: Insufficient documentation

## 2013-06-02 DIAGNOSIS — Z8639 Personal history of other endocrine, nutritional and metabolic disease: Secondary | ICD-10-CM | POA: Insufficient documentation

## 2013-06-02 DIAGNOSIS — Z79899 Other long term (current) drug therapy: Secondary | ICD-10-CM | POA: Insufficient documentation

## 2013-06-02 DIAGNOSIS — Z862 Personal history of diseases of the blood and blood-forming organs and certain disorders involving the immune mechanism: Secondary | ICD-10-CM | POA: Insufficient documentation

## 2013-06-02 DIAGNOSIS — Y92410 Unspecified street and highway as the place of occurrence of the external cause: Secondary | ICD-10-CM | POA: Insufficient documentation

## 2013-06-02 DIAGNOSIS — Z87891 Personal history of nicotine dependence: Secondary | ICD-10-CM | POA: Insufficient documentation

## 2013-06-02 MED ORDER — NAPROXEN 500 MG PO TABS: 500.0000 mg | ORAL_TABLET | Freq: Two times a day (BID) | ORAL | Status: DC

## 2013-06-02 NOTE — ED Notes (Signed)
Larey Seat out of truck yesterday.  C/o pain to left ankle.  Reports unable to feel toes.

## 2013-06-02 NOTE — ED Provider Notes (Signed)
CSN: [id number]     Arrival date & time 06/02/13  1056 History   First MD Initiated Contact with Patient 06/02/13 1120     Chief Complaint  Patient presents with  . Foot Pain  . Ankle Pain   (Consider location/radiation/quality/duration/timing/severity/associated sxs/prior Treatment) Patient is a 22 y.o. female presenting with ankle pain. The history is provided by the patient.  Ankle Pain Location:  Ankle Time since incident:  1 day Injury: yes   Mechanism of injury: fall   Fall:    Fall occurred: fell while getting out of a truck.  twisting her ankle and foot.     Point of impact:  Feet   Entrapped after fall: no   Ankle location:  L ankle Pain details:    Quality:  Aching and throbbing   Radiates to:  Does not radiate   Severity:  Mild   Onset quality:  Sudden Chronicity:  New Dislocation: no   Foreign body present:  No foreign bodies Prior injury to area:  No Relieved by:  Nothing Worsened by:  Activity and bearing weight Ineffective treatments:  None tried Associated symptoms: numbness and tingling   Associated symptoms: no back pain, no decreased ROM, no fever, no muscle weakness, no neck pain and no swelling     Past Medical History  Diagnosis Date  . Asthma   . Amenorrhea 03/13/2013  . PCO (polycystic ovaries) 03/27/2013   Past Surgical History  Procedure Laterality Date  . No past surgeries     Family History  Problem Relation Age of Onset  . Thyroid disease Father   . Cancer Maternal Aunt     ovarian  . Cancer Maternal Grandmother     ovarian   History  Substance Use Topics  . Smoking status: Former Smoker    Types: Cigarettes  . Smokeless tobacco: Never Used  . Alcohol Use: No   OB History   Grav Para Term Preterm Abortions TAB SAB Ect Mult Living                 Review of Systems  Constitutional: Negative for fever and chills.  HENT: Negative for neck pain.   Genitourinary: Negative for dysuria and difficulty urinating.  Musculoskeletal:  Positive for arthralgias. Negative for back pain and joint swelling.  Skin: Negative for color change and wound.  Neurological: Negative for dizziness, facial asymmetry, weakness, light-headedness and headaches.  All other systems reviewed and are negative.    Allergies  Review of patient's allergies indicates no known allergies.  Home Medications   Current Outpatient Rx  Name  Route  Sig  Dispense  Refill  . norgestimate-ethinyl estradiol (ORTHO-CYCLEN,SPRINTEC,PREVIFEM) 0.25-35 MG-MCG tablet   Oral   Take 1 tablet by mouth daily.   1 Package   11    BP 122/67  Pulse 86  Temp(Src) 98.3 F (36.8 C) (Oral)  Resp 16  Ht 5\' 1"  (1.549 m)  Wt 193 lb (87.544 kg)  BMI 36.49 kg/m2  SpO2 97%  LMP 05/07/2013 Physical Exam  Nursing note and vitals reviewed. Constitutional: She is oriented to person, place, and time. She appears well-developed and well-nourished. No distress.  HENT:  Head: Normocephalic and atraumatic.  Cardiovascular: Normal rate, regular rhythm, normal heart sounds and intact distal pulses.   Pulmonary/Chest: Effort normal and breath sounds normal.  Musculoskeletal: She exhibits tenderness. She exhibits no edema.  ttp of the lateral left ankle. mild edema.  DP pulse is brisk,distal sensation intact.  CR< 2 sec.  No erythema, abrasion, bruising or bony deformity.  No proximal tenderness. Skin is warm and dry to touch  Neurological: She is alert and oriented to person, place, and time. She exhibits normal muscle tone. Coordination normal.  Skin: Skin is warm and dry.    ED Course  Procedures (including critical care time) Labs Review Labs Reviewed - No data to display Imaging Review Dg Ankle Complete Left  06/02/2013   *RADIOLOGY REPORT*  Clinical Data: Status post fall.  Lateral left ankle pain.  LEFT ANKLE COMPLETE - 3+ VIEW  Comparison: None.  Findings: Imaged bones, joints and soft tissues appear normal.  IMPRESSION: Negative examination.   Original Report  Authenticated By: Holley Dexter, M.D.    MDM   ttp of the lateral right ankle.  Mild STS present.  C/o tingling to her toes, but CR< 2 sec.  Reported tingling sensation to her toes but distal sensation is intact on exam.  DP pulse brisk.  Foot is NT, no deformities.    ASO splint applied, pain improved , remains NV intact.    Pt agrees to RICE therapy and orthopedic f/u if the pain does not improve   Cordia Miklos L. Trisha Mangle, PA-C 06/03/13 2224

## 2013-06-05 NOTE — ED Provider Notes (Signed)
Medical screening examination/treatment/procedure(s) were performed by non-physician practitioner and as supervising physician I was immediately available for consultation/collaboration.     Trinaty Bundrick R Rustin Erhart, MD 06/05/13 1547 

## 2013-06-22 ENCOUNTER — Emergency Department (HOSPITAL_COMMUNITY)
Admission: EM | Admit: 2013-06-22 | Discharge: 2013-06-22 | Disposition: A | Discharge status: Home / Self Care | Payer: PRIVATE HEALTH INSURANCE | Attending: Emergency Medicine | Admitting: Emergency Medicine

## 2013-06-22 ENCOUNTER — Emergency Department (HOSPITAL_COMMUNITY): Payer: PRIVATE HEALTH INSURANCE

## 2013-06-22 ENCOUNTER — Encounter (HOSPITAL_COMMUNITY): Payer: Self-pay | Admitting: *Deleted

## 2013-06-22 DIAGNOSIS — W010XXA Fall on same level from slipping, tripping and stumbling without subsequent striking against object, initial encounter: Secondary | ICD-10-CM | POA: Insufficient documentation

## 2013-06-22 DIAGNOSIS — J45909 Unspecified asthma, uncomplicated: Secondary | ICD-10-CM | POA: Insufficient documentation

## 2013-06-22 DIAGNOSIS — Y929 Unspecified place or not applicable: Secondary | ICD-10-CM | POA: Insufficient documentation

## 2013-06-22 DIAGNOSIS — Z87891 Personal history of nicotine dependence: Secondary | ICD-10-CM | POA: Insufficient documentation

## 2013-06-22 DIAGNOSIS — Y939 Activity, unspecified: Secondary | ICD-10-CM | POA: Insufficient documentation

## 2013-06-22 DIAGNOSIS — S63502A Unspecified sprain of left wrist, initial encounter: Secondary | ICD-10-CM

## 2013-06-22 DIAGNOSIS — Z79899 Other long term (current) drug therapy: Secondary | ICD-10-CM | POA: Insufficient documentation

## 2013-06-22 DIAGNOSIS — Z8742 Personal history of other diseases of the female genital tract: Secondary | ICD-10-CM | POA: Insufficient documentation

## 2013-06-22 DIAGNOSIS — S63509A Unspecified sprain of unspecified wrist, initial encounter: Secondary | ICD-10-CM | POA: Insufficient documentation

## 2013-06-22 MED ORDER — IBUPROFEN 800 MG PO TABS: 800.0000 mg | ORAL_TABLET | Freq: Three times a day (TID) | ORAL | Status: DC

## 2013-06-22 MED ORDER — IBUPROFEN 800 MG PO TABS
800.0000 mg | ORAL_TABLET | Freq: Once | ORAL | Status: AC
  Administered 2013-06-22: 800 mg via ORAL
  Filled 2013-06-22: qty 1

## 2013-06-22 NOTE — ED Notes (Signed)
Fell , injury  To rt arm,  Good radial pulse.

## 2013-06-24 NOTE — ED Provider Notes (Signed)
CSN: [id number]     Arrival date & time 06/22/13  2025 History   First MD Initiated Contact with Patient 06/22/13 2104     Chief Complaint  Patient presents with  . Arm Pain   (Consider location/radiation/quality/duration/timing/severity/associated sxs/prior Treatment) HPI Comments: Krystal Porter is a 22 y.o. female who presents to the Emergency Department complaining of left  wrist and forearm pain that began after she slipped and fell, landing on her arm.  Pain is reproduced with movement of her wrist and arm, pain improves with rest.  She denies neck pain, head injury, LOC,  numbness or weakness of the left arm or fingers. Has not tried any therapies prior to ed arrival.   Patient is a 22 y.o. female presenting with arm pain. The history is provided by the patient.  Arm Pain This is a new problem. The current episode started today. The problem occurs constantly. The problem has been unchanged. Associated symptoms include arthralgias. Pertinent negatives include no abdominal pain, chest pain, chills, fever, headaches, joint swelling, neck pain, numbness, rash, vomiting or weakness. The symptoms are aggravated by bending and twisting. She has tried nothing for the symptoms. The treatment provided no relief.    Past Medical History  Diagnosis Date  . Asthma   . Amenorrhea 03/13/2013  . PCO (polycystic ovaries) 03/27/2013   Past Surgical History  Procedure Laterality Date  . No past surgeries     Family History  Problem Relation Age of Onset  . Thyroid disease Father   . Cancer Maternal Aunt     ovarian  . Cancer Maternal Grandmother     ovarian   History  Substance Use Topics  . Smoking status: Former Smoker    Types: Cigarettes  . Smokeless tobacco: Never Used  . Alcohol Use: No   OB History   Grav Para Term Preterm Abortions TAB SAB Ect Mult Living                 Review of Systems  Constitutional: Negative for fever and chills.  HENT: Negative for neck pain.    Respiratory: Negative for chest tightness and shortness of breath.   Cardiovascular: Negative for chest pain.  Gastrointestinal: Negative for vomiting and abdominal pain.  Genitourinary: Negative for dysuria and difficulty urinating.  Musculoskeletal: Positive for arthralgias. Negative for joint swelling.  Skin: Negative for color change, rash and wound.  Neurological: Negative for weakness, numbness and headaches.  All other systems reviewed and are negative.    Allergies  Review of patient's allergies indicates no known allergies.  Home Medications   Current Outpatient Rx  Name  Route  Sig  Dispense  Refill  . diphenhydrAMINE (BENADRYL) 25 MG tablet   Oral   Take 25 mg by mouth every 6 (six) hours as needed for allergies.         . norgestimate-ethinyl estradiol (ORTHO-CYCLEN,SPRINTEC,PREVIFEM) 0.25-35 MG-MCG tablet   Oral   Take 1 tablet by mouth daily.   1 Package   11   . ibuprofen (ADVIL,MOTRIN) 800 MG tablet   Oral   Take 1 tablet (800 mg total) by mouth 3 (three) times daily.   21 tablet   0    BP 143/76  Pulse 99  Temp(Src) 98.1 F (36.7 C) (Oral)  Resp 24  Ht 5' 3.5" (1.613 m)  Wt 191 lb (86.637 kg)  BMI 33.3 kg/m2  SpO2 92%  LMP 05/25/2013 Physical Exam  Nursing note and vitals reviewed. Constitutional: She is oriented  to person, place, and time. She appears well-developed and well-nourished. No distress.  HENT:  Head: Normocephalic and atraumatic.  Cardiovascular: Normal rate, regular rhythm and normal heart sounds.   Pulmonary/Chest: Effort normal and breath sounds normal.  Musculoskeletal: She exhibits tenderness. She exhibits no edema.  Left dorsal wrist and mid forearm is ttp. Radial pulse is brisk, distal sensation intact.  CR< 2 sec.  No edema bruising or bony deformity. Pain is reproduced with dorsiflexion .   Compartments soft.  Neurological: She is alert and oriented to person, place, and time. She exhibits normal muscle tone.  Coordination normal.  Skin: Skin is warm and dry.    ED Course  Procedures (including critical care time) Labs Review Labs Reviewed - No data to display Imaging Review Dg Forearm Left  06/22/2013   CLINICAL DATA:  Fall. Left wrist pain. Left forearm pain.  EXAM: LEFT FOREARM - 2 VIEW  COMPARISON:  None.  FINDINGS: There is no evidence of fracture or other focal bone lesions. Soft tissues are unremarkable.  IMPRESSION: Negative.   Electronically Signed   By: Andreas Newport M.D.   On: 06/22/2013 21:09   Dg Wrist Complete Left  06/22/2013   CLINICAL DATA:  Fall. Left wrist pain. Left elbow pain.  EXAM: LEFT WRIST - COMPLETE 3+ VIEW  COMPARISON:  None.  FINDINGS: Anatomic alignment of the left wrist. Scaphoid bone intact. There is no fracture. Soft tissues appear within normal limits.  IMPRESSION: Negative.   Electronically Signed   By: Andreas Newport M.D.   On: 06/22/2013 21:09    MDM   1. Sprain of wrist, left, initial encounter      velcro wrist splint applied.  Pain improved.  Remains NV intact  Patient agrees to RICE therapy and to f/u with ortho if the pain is not improving.      Blimie Vaness L. Shawne Eskelson, PA-C 06/24/13 0100

## 2013-06-25 NOTE — ED Provider Notes (Signed)
Medical screening examination/treatment/procedure(s) were performed by non-physician practitioner and as supervising physician I was immediately available for consultation/collaboration.  Juliet Rude. Rubin Payor, MD 06/25/13 [phone]

## 2013-07-14 ENCOUNTER — Encounter: Payer: Self-pay | Admitting: Adult Health

## 2013-07-14 ENCOUNTER — Ambulatory Visit (INDEPENDENT_AMBULATORY_CARE_PROVIDER_SITE_OTHER): Payer: PRIVATE HEALTH INSURANCE | Admitting: Adult Health

## 2013-07-14 VITALS — BP 120/80 | Ht 63.0 in | Wt 188.0 lb

## 2013-07-14 DIAGNOSIS — Z319 Encounter for procreative management, unspecified: Secondary | ICD-10-CM

## 2013-07-14 HISTORY — DX: Encounter for procreative management, unspecified: Z31.9

## 2013-07-14 NOTE — Progress Notes (Signed)
Subjective:     Patient ID: Krystal Porter, female   DOB: 02-27-1991, 22 y.o.   MRN: [id number]  HPI Krystal Porter is back in to discuss pregnancy.She has been have regular periods with sprintec and would like to get pregnant.Has history of PCO and amenorrhea.  Review of Systems See HPI Reviewed past medical,surgical, social and family history. Reviewed medications and allergies.     Objective:   Physical Exam BP 120/80  Ht 5\' 3"  (1.6 m)  Wt 188 lb (85.276 kg)  BMI 33.31 kg/m2  LMP 07/05/2013   finish current pack of pills and start having sex day 5 through 24 every other day, pee before sex and lay there 1 hour after sex, check progesterone level day 21 of cycle, also discussed clomid if needed.  Assessment:     Desires pregnancy    Plan:     Review [facility] handout on fertility Take PNV with folic acid Call with period to schedule progesterone level

## 2013-07-14 NOTE — Patient Instructions (Signed)
Review handout on fertility Call with next period

## 2013-07-31 ENCOUNTER — Telehealth: Payer: Self-pay | Admitting: Adult Health

## 2013-07-31 NOTE — Telephone Encounter (Signed)
Started period yesterday havs sex every other day from day 5 -24 check progesterone November 20.

## 2013-08-20 ENCOUNTER — Other Ambulatory Visit: Payer: PRIVATE HEALTH INSURANCE

## 2013-08-20 ENCOUNTER — Encounter (INDEPENDENT_AMBULATORY_CARE_PROVIDER_SITE_OTHER): Payer: Self-pay

## 2013-08-20 DIAGNOSIS — IMO0002 Reserved for concepts with insufficient information to code with codable children: Secondary | ICD-10-CM

## 2013-08-21 ENCOUNTER — Telehealth: Payer: Self-pay | Admitting: Adult Health

## 2013-08-21 LAB — PROGESTERONE: Progesterone: 0.3 ng/mL

## 2013-08-21 NOTE — Telephone Encounter (Signed)
No longer in service

## 2013-08-24 ENCOUNTER — Telehealth: Payer: Self-pay | Admitting: Obstetrics and Gynecology

## 2013-08-24 NOTE — Telephone Encounter (Signed)
Pt called wanting progesterone level. Pt advised of level and that she did not ovulate this time. Pt wants to know what the next step is. Will route to JAG and she what she will advise.

## 2013-08-24 NOTE — Telephone Encounter (Signed)
Discussed starting clomid, if desired will talk to husband and call back

## 2013-08-24 NOTE — Telephone Encounter (Signed)
Wrong pt

## 2013-09-02 ENCOUNTER — Telehealth: Payer: Self-pay | Admitting: Adult Health

## 2013-09-02 NOTE — Telephone Encounter (Signed)
Has not started yet , if it a few more weeks if no period by January call to be seen

## 2013-09-27 ENCOUNTER — Emergency Department (HOSPITAL_COMMUNITY)
Admission: EM | Admit: 2013-09-27 | Discharge: 2013-09-27 | Disposition: A | Discharge status: Home / Self Care | Payer: PRIVATE HEALTH INSURANCE | Attending: Emergency Medicine | Admitting: Emergency Medicine

## 2013-09-27 ENCOUNTER — Encounter (HOSPITAL_COMMUNITY): Payer: Self-pay | Admitting: Emergency Medicine

## 2013-09-27 ENCOUNTER — Emergency Department (HOSPITAL_COMMUNITY): Payer: PRIVATE HEALTH INSURANCE

## 2013-09-27 DIAGNOSIS — Z8639 Personal history of other endocrine, nutritional and metabolic disease: Secondary | ICD-10-CM | POA: Insufficient documentation

## 2013-09-27 DIAGNOSIS — Z8742 Personal history of other diseases of the female genital tract: Secondary | ICD-10-CM | POA: Insufficient documentation

## 2013-09-27 DIAGNOSIS — Z3202 Encounter for pregnancy test, result negative: Secondary | ICD-10-CM | POA: Insufficient documentation

## 2013-09-27 DIAGNOSIS — Z79899 Other long term (current) drug therapy: Secondary | ICD-10-CM | POA: Insufficient documentation

## 2013-09-27 DIAGNOSIS — R0989 Other specified symptoms and signs involving the circulatory and respiratory systems: Secondary | ICD-10-CM | POA: Insufficient documentation

## 2013-09-27 DIAGNOSIS — R0609 Other forms of dyspnea: Secondary | ICD-10-CM | POA: Insufficient documentation

## 2013-09-27 DIAGNOSIS — J45901 Unspecified asthma with (acute) exacerbation: Secondary | ICD-10-CM | POA: Insufficient documentation

## 2013-09-27 DIAGNOSIS — IMO0002 Reserved for concepts with insufficient information to code with codable children: Secondary | ICD-10-CM | POA: Insufficient documentation

## 2013-09-27 DIAGNOSIS — Z862 Personal history of diseases of the blood and blood-forming organs and certain disorders involving the immune mechanism: Secondary | ICD-10-CM | POA: Insufficient documentation

## 2013-09-27 DIAGNOSIS — R12 Heartburn: Secondary | ICD-10-CM | POA: Insufficient documentation

## 2013-09-27 DIAGNOSIS — R0600 Dyspnea, unspecified: Secondary | ICD-10-CM

## 2013-09-27 DIAGNOSIS — Z87891 Personal history of nicotine dependence: Secondary | ICD-10-CM | POA: Insufficient documentation

## 2013-09-27 LAB — POCT PREGNANCY, URINE: Preg Test, Ur: NEGATIVE

## 2013-09-27 MED ORDER — ALBUTEROL SULFATE (5 MG/ML) 0.5% IN NEBU
5.0000 mg | INHALATION_SOLUTION | Freq: Once | RESPIRATORY_TRACT | Status: AC
  Administered 2013-09-27: 5 mg via RESPIRATORY_TRACT
  Filled 2013-09-27: qty 1

## 2013-09-27 MED ORDER — PREDNISONE 50 MG PO TABS
60.0000 mg | ORAL_TABLET | Freq: Once | ORAL | Status: AC
  Administered 2013-09-27: 60 mg via ORAL
  Filled 2013-09-27 (×2): qty 1

## 2013-09-27 MED ORDER — ALBUTEROL SULFATE HFA 108 (90 BASE) MCG/ACT IN AERS
2.0000 | INHALATION_SPRAY | Freq: Once | RESPIRATORY_TRACT | Status: AC
  Administered 2013-09-27: 2 via RESPIRATORY_TRACT
  Filled 2013-09-27: qty 6.7

## 2013-09-27 MED ORDER — ALBUTEROL SULFATE HFA 108 (90 BASE) MCG/ACT IN AERS: 2.0000 | INHALATION_SPRAY | Freq: Once | RESPIRATORY_TRACT | Status: DC

## 2013-09-27 MED ORDER — PREDNISONE 20 MG PO TABS: ORAL_TABLET | ORAL | Status: DC

## 2013-09-27 MED ORDER — HYDROCOD POLST-CHLORPHEN POLST 10-8 MG/5ML PO LQCR
5.0000 mL | Freq: Once | ORAL | Status: AC
  Administered 2013-09-27: 5 mL via ORAL
  Filled 2013-09-27: qty 5

## 2013-09-27 MED ORDER — IPRATROPIUM BROMIDE 0.02 % IN SOLN
0.5000 mg | Freq: Once | RESPIRATORY_TRACT | Status: AC
  Administered 2013-09-27: 0.5 mg via RESPIRATORY_TRACT
  Filled 2013-09-27: qty 2.5

## 2013-09-27 NOTE — ED Notes (Signed)
Pt called out stating she felt more sob. Pt in no distress at this time.

## 2013-09-27 NOTE — ED Notes (Signed)
Family and pt wanting to know how long its going to be before pt is going to be seen. Advised pt and family that the new EDP coming in, would be seeing pt.

## 2013-09-27 NOTE — ED Notes (Signed)
Called nurse into room.  States her she can't breathe again and her chest hurts.  Assessed pt.  Lungs clear.  Pulse ox 98 percent.  Pt in no distress.  States she feels okay to go home.

## 2013-09-27 NOTE — ED Notes (Signed)
Pt states she woke up around 3am feeling like she was having an asthma attack. Pt states she feels like she has to gasp for air, c/o weakness, and her chest is burning. Used inhaler without relief.

## 2013-09-27 NOTE — ED Provider Notes (Signed)
CSN: [id number]     Arrival date & time 09/27/13  0419 History   First MD Initiated Contact with Patient 09/27/13 [phone]     Chief Complaint  Patient presents with  . Shortness of Breath   (Consider location/radiation/quality/duration/timing/severity/associated sxs/prior Treatment) HPI..... shortness of breath since 3 AM with associated burning in her chest.   Past medical history of asthma. No fever, chills, rusty sputum. Severity is mild. No radiation of pain. Exertion makes symptoms worse  Past Medical History  Diagnosis Date  . Asthma   . Amenorrhea 03/13/2013  . PCO (polycystic ovaries) 03/27/2013  . Patient desires pregnancy 07/14/2013   Past Surgical History  Procedure Laterality Date  . No past surgeries     Family History  Problem Relation Age of Onset  . Thyroid disease Father   . Cancer Maternal Aunt     ovarian  . Cancer Maternal Grandmother     ovarian   History  Substance Use Topics  . Smoking status: Former Smoker    Types: Cigarettes  . Smokeless tobacco: Never Used  . Alcohol Use: No   OB History   Grav Para Term Preterm Abortions TAB SAB Ect Mult Living                 Review of Systems  All other systems reviewed and are negative.    Allergies  Review of patient's allergies indicates no known allergies.  Home Medications   Current Outpatient Rx  Name  Route  Sig  Dispense  Refill  . albuterol (PROVENTIL HFA;VENTOLIN HFA) 108 (90 BASE) MCG/ACT inhaler   Inhalation   Inhale into the lungs every 6 (six) hours as needed for wheezing or shortness of breath.         . predniSONE (DELTASONE) 20 MG tablet      3 tabs po day one, then 2 po daily x 4 days   11 tablet   0    BP 114/71  Pulse 75  Temp(Src) 97.6 F (36.4 C) (Oral)  Resp 20  Ht 5\' 2"  (1.575 m)  Wt 188 lb (85.276 kg)  BMI 34.38 kg/m2  SpO2 98%  LMP 07/05/2013 Physical Exam  Nursing note and vitals reviewed. Constitutional: She is oriented to person, place, and time. She  appears well-developed and well-nourished.  HENT:  Head: Normocephalic and atraumatic.  Eyes: Conjunctivae and EOM are normal. Pupils are equal, round, and reactive to light.  Neck: Normal range of motion. Neck supple.  Cardiovascular: Normal rate, regular rhythm and normal heart sounds.   Pulmonary/Chest: Effort normal.  Bilateral expiratory wheeze  Abdominal: Soft. Bowel sounds are normal.  Musculoskeletal: Normal range of motion.  Neurological: She is alert and oriented to person, place, and time.  Skin: Skin is warm and dry.  Psychiatric: She has a normal mood and affect. Her behavior is normal.    ED Course  Procedures (including critical care time) Labs Review Labs Reviewed  POCT PREGNANCY, URINE   Imaging Review Dg Chest 2 View  09/27/2013   CLINICAL DATA:  Shortness of breath and cough  EXAM: CHEST  2 VIEW  COMPARISON:  PA and lateral chest x-ray dated August 17, 2012.  FINDINGS: The lungs are reasonably well inflated and clear. The cardiopericardial silhouette is top-normal in size. The pulmonary vascularity is not engorged. The mediastinum is normal in width. There is no pleural effusion. The observed portions of the bony thorax exhibit no acute abnormalities.  IMPRESSION: There is no evidence of pneumonia  nor CHF nor other acute cardiopulmonary abnormality.   Electronically Signed   By: David  [place]   On: 09/27/2013 11:02    EKG Interpretation   None       MDM   1. Dyspnea     Patient feels better after breathing treatment. She is low risk for acute coronary syndrome or pulmonary embolism. Discharge medications prednisone and albuterol inhaler    Donnetta Hutching, MD 09/27/13 1202

## 2013-11-09 ENCOUNTER — Ambulatory Visit (INDEPENDENT_AMBULATORY_CARE_PROVIDER_SITE_OTHER): Payer: PRIVATE HEALTH INSURANCE | Admitting: Adult Health

## 2013-11-09 ENCOUNTER — Encounter: Payer: Self-pay | Admitting: Adult Health

## 2013-11-09 ENCOUNTER — Telehealth: Payer: Self-pay | Admitting: Adult Health

## 2013-11-09 VITALS — BP 120/86 | Ht 64.0 in | Wt 193.0 lb

## 2013-11-09 DIAGNOSIS — Z3200 Encounter for pregnancy test, result unknown: Secondary | ICD-10-CM

## 2013-11-09 DIAGNOSIS — R1032 Left lower quadrant pain: Secondary | ICD-10-CM

## 2013-11-09 DIAGNOSIS — Z3202 Encounter for pregnancy test, result negative: Secondary | ICD-10-CM

## 2013-11-09 HISTORY — DX: Left lower quadrant pain: R10.32

## 2013-11-09 LAB — POCT URINE PREGNANCY: PREG TEST UR: NEGATIVE

## 2013-11-09 NOTE — Progress Notes (Addendum)
Subjective:     Patient ID: Krystal Porter, female   DOB: 10/13/1990, 23 y.o.   MRN: [id number]  HPI Krystal Porter is a 23 year old white female in complaining of pain left side and ?mass felt when standing.  Review of Systems See HPI Reviewed past medical,surgical, social and family history. Reviewed medications and allergies.     Objective:   Physical Exam BP 120/86  Ht 5\' 4"  (1.626 m)  Wt 193 lb (87.544 kg)  BMI 33.11 kg/m2   UPT negative, has tenderness LLQ, near mons pubis, no mass felt,lying down or standing, will get US to evaluate, has not had period in over 3 months, would like to be pregnant.  Assessment:     LLQ pain     Plan:     Return in 1 week for US and see me

## 2013-11-09 NOTE — Telephone Encounter (Signed)
Pt states has a "knot in abdomen, seems to be getting bigger and causing pain." pt still has not had a period with her OCP. Appt made to see Cyril MourningJennifer Griffin, NP today at 11:30.

## 2013-11-09 NOTE — Patient Instructions (Signed)
Pelvic Pain, Female Female pelvic pain can be caused by many different things and start from a variety of places. Pelvic pain refers to pain that is located in the lower half of the abdomen and between your hips. The pain may occur over a short period of time (acute) or may be reoccurring (chronic). The cause of pelvic pain may be related to disorders affecting the female reproductive organs (gynecologic), but it may also be related to the bladder, kidney stones, an intestinal complication, or muscle or skeletal problems. Getting help right away for pelvic pain is important, especially if there has been severe, sharp, or a sudden onset of unusual pain. It is also important to get help right away because some types of pelvic pain can be life threatening.  CAUSES  Below are only some of the causes of pelvic pain. The causes of pelvic pain can be in one of several categories.   Gynecologic.  Pelvic inflammatory disease.  Sexually transmitted infection.  Ovarian cyst or a twisted ovarian ligament (ovarian torsion).  Uterine lining that grows outside the uterus (endometriosis).  Fibroids, cysts, or tumors.  Ovulation.  Pregnancy.  Pregnancy that occurs outside the uterus (ectopic pregnancy).  Miscarriage.  Labor.  Abruption of the placenta or ruptured uterus.  Infection.  Uterine infection (endometritis).  Bladder infection.  Diverticulitis.  Miscarriage related to a uterine infection (septic abortion).  Bladder.  Inflammation of the bladder (cystitis).  Kidney stone(s).  Gastrointenstinal.  Constipation.  Diverticulitis.  Neurologic.  Trauma.  Feeling pelvic pain because of mental or emotional causes (psychosomatic).  Cancers of the bowel or pelvis. EVALUATION  Your caregiver will want to take a careful history of your concerns. This includes recent changes in your health, a careful gynecologic history of your periods (menses), and a sexual history. Obtaining  your family history and medical history is also important. Your caregiver may suggest a pelvic exam. A pelvic exam will help identify the location and severity of the pain. It also helps in the evaluation of which organ system may be involved. In order to identify the cause of the pelvic pain and be properly treated, your caregiver may order tests. These tests may include:   A pregnancy test.  Pelvic ultrasonography.  An X-ray exam of the abdomen.  A urinalysis or evaluation of vaginal discharge.  Blood tests. HOME CARE INSTRUCTIONS   Only take over-the-counter or prescription medicines for pain, discomfort, or fever as directed by your caregiver.   Rest as directed by your caregiver.   Eat a balanced diet.   Drink enough fluids to make your urine clear or pale yellow, or as directed.   Avoid sexual intercourse if it causes pain.   Apply warm or cold compresses to the lower abdomen depending on which one helps the pain.   Avoid stressful situations.   Keep a journal of your pelvic pain. Write down when it started, where the pain is located, and if there are things that seem to be associated with the pain, such as food or your menstrual cycle.  Follow up with your caregiver as directed.  SEEK MEDICAL CARE IF:  Your medicine does not help your pain.  You have abnormal vaginal discharge. SEEK IMMEDIATE MEDICAL CARE IF:   You have heavy bleeding from the vagina.   Your pelvic pain increases.   You feel lightheaded or faint.   You have chills.   You have pain with urination or blood in your urine.   You have uncontrolled  diarrhea or vomiting.   You have a fever or persistent symptoms for more than 3 days.  You have a fever and your symptoms suddenly get worse.   You are being physically or sexually abused.  MAKE SURE YOU:  Understand these instructions.  Will watch your condition.  Will get help if you are not doing well or get worse. Document  Released: 08/14/2004 Document Revised: 03/18/2012 Document Reviewed: 01/07/2012 [facility] Patient Information 2014 [place], [place]. Return in 1 week for [place]

## 2013-11-11 ENCOUNTER — Other Ambulatory Visit: Payer: Self-pay | Admitting: Adult Health

## 2013-11-11 DIAGNOSIS — N9489 Other specified conditions associated with female genital organs and menstrual cycle: Secondary | ICD-10-CM

## 2013-11-16 ENCOUNTER — Encounter: Payer: Self-pay | Admitting: Adult Health

## 2013-11-16 ENCOUNTER — Ambulatory Visit (INDEPENDENT_AMBULATORY_CARE_PROVIDER_SITE_OTHER): Payer: PRIVATE HEALTH INSURANCE

## 2013-11-16 ENCOUNTER — Ambulatory Visit (INDEPENDENT_AMBULATORY_CARE_PROVIDER_SITE_OTHER): Payer: PRIVATE HEALTH INSURANCE | Admitting: Adult Health

## 2013-11-16 VITALS — BP 120/82 | Ht 63.0 in | Wt 193.0 lb

## 2013-11-16 DIAGNOSIS — Z319 Encounter for procreative management, unspecified: Secondary | ICD-10-CM

## 2013-11-16 DIAGNOSIS — Z3202 Encounter for pregnancy test, result negative: Secondary | ICD-10-CM

## 2013-11-16 DIAGNOSIS — N9489 Other specified conditions associated with female genital organs and menstrual cycle: Secondary | ICD-10-CM

## 2013-11-16 DIAGNOSIS — Z3009 Encounter for other general counseling and advice on contraception: Secondary | ICD-10-CM

## 2013-11-16 LAB — POCT URINE PREGNANCY: PREG TEST UR: NEGATIVE

## 2013-11-16 MED ORDER — MEDROXYPROGESTERONE ACETATE 10 MG PO TABS: 10.0000 mg | ORAL_TABLET | Freq: Every day | ORAL | Status: DC
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
## 2013-11-16 NOTE — Patient Instructions (Signed)
Take provera every day x 10 days Call when starts Take prenatal vitamins

## 2013-11-30 ENCOUNTER — Telehealth: Payer: Self-pay | Admitting: Adult Health

## 2013-11-30 NOTE — Telephone Encounter (Signed)
Pt to have sex every other day day 5-24 of cycle and check progesterone level 3/21

## 2013-12-07 ENCOUNTER — Telehealth: Payer: Self-pay | Admitting: *Deleted

## 2013-12-11 NOTE — Telephone Encounter (Signed)
Pt aware that she does not have to have an appointment to go get labs done.

## 2013-12-19 ENCOUNTER — Other Ambulatory Visit: Payer: Self-pay | Admitting: Adult Health

## 2013-12-22 ENCOUNTER — Telehealth: Payer: Self-pay | Admitting: Obstetrics and Gynecology

## 2013-12-22 NOTE — Telephone Encounter (Signed)
Pt informed Progesterone results pending per Genelle GatherJennifer Griffin,NP.

## 2013-12-23 ENCOUNTER — Telehealth: Payer: Self-pay | Admitting: Obstetrics and Gynecology

## 2013-12-23 NOTE — Telephone Encounter (Signed)
Pt aware per Larita FifeLynn results still pending will not be back until Friday, March 27,2015.

## 2013-12-23 NOTE — Telephone Encounter (Signed)
Pt informed results still pending, spoke with Larita FifeLynn lab tech and she is going to try to get results.

## 2013-12-25 ENCOUNTER — Telehealth: Payer: Self-pay | Admitting: Adult Health

## 2013-12-25 LAB — PROGESTERONE: Progesterone: 0.7 ng/mL

## 2013-12-25 NOTE — Telephone Encounter (Signed)
Pt aware progesterone shows she did not ovulate, make appt to discuss options.

## 2014-01-28 ENCOUNTER — Other Ambulatory Visit (HOSPITAL_COMMUNITY): Payer: Self-pay | Admitting: Family Medicine

## 2014-01-28 ENCOUNTER — Ambulatory Visit (HOSPITAL_COMMUNITY)
Admission: RE | Admit: 2014-01-28 | Discharge: 2014-01-28 | Disposition: A | Discharge status: Home / Self Care | Payer: PRIVATE HEALTH INSURANCE | Source: Ambulatory Visit | Attending: Family Medicine | Admitting: Family Medicine

## 2014-01-28 DIAGNOSIS — R1031 Right lower quadrant pain: Secondary | ICD-10-CM | POA: Insufficient documentation

## 2014-01-28 DIAGNOSIS — R112 Nausea with vomiting, unspecified: Secondary | ICD-10-CM | POA: Insufficient documentation

## 2014-01-28 DIAGNOSIS — R197 Diarrhea, unspecified: Secondary | ICD-10-CM | POA: Insufficient documentation

## 2014-01-28 DIAGNOSIS — R109 Unspecified abdominal pain: Secondary | ICD-10-CM

## 2014-01-28 DIAGNOSIS — M549 Dorsalgia, unspecified: Secondary | ICD-10-CM

## 2014-01-28 DIAGNOSIS — K5900 Constipation, unspecified: Secondary | ICD-10-CM | POA: Insufficient documentation

## 2015-11-30 ENCOUNTER — Emergency Department (HOSPITAL_COMMUNITY)
Admission: EM | Admit: 2015-11-30 | Discharge: 2015-11-30 | Disposition: A | Discharge status: Home / Self Care | Payer: PRIVATE HEALTH INSURANCE | Attending: Emergency Medicine | Admitting: Emergency Medicine

## 2015-11-30 ENCOUNTER — Emergency Department (HOSPITAL_COMMUNITY): Payer: PRIVATE HEALTH INSURANCE

## 2015-11-30 ENCOUNTER — Encounter (HOSPITAL_COMMUNITY): Payer: Self-pay

## 2015-11-30 DIAGNOSIS — S199XXA Unspecified injury of neck, initial encounter: Secondary | ICD-10-CM | POA: Insufficient documentation

## 2015-11-30 DIAGNOSIS — S4991XA Unspecified injury of right shoulder and upper arm, initial encounter: Secondary | ICD-10-CM | POA: Diagnosis not present

## 2015-11-30 DIAGNOSIS — Z8742 Personal history of other diseases of the female genital tract: Secondary | ICD-10-CM | POA: Insufficient documentation

## 2015-11-30 DIAGNOSIS — Y92410 Unspecified street and highway as the place of occurrence of the external cause: Secondary | ICD-10-CM | POA: Diagnosis not present

## 2015-11-30 DIAGNOSIS — Y9389 Activity, other specified: Secondary | ICD-10-CM | POA: Diagnosis not present

## 2015-11-30 DIAGNOSIS — J45909 Unspecified asthma, uncomplicated: Secondary | ICD-10-CM | POA: Diagnosis not present

## 2015-11-30 DIAGNOSIS — Y998 Other external cause status: Secondary | ICD-10-CM | POA: Insufficient documentation

## 2015-11-30 DIAGNOSIS — Z8639 Personal history of other endocrine, nutritional and metabolic disease: Secondary | ICD-10-CM | POA: Diagnosis not present

## 2015-11-30 DIAGNOSIS — Z79899 Other long term (current) drug therapy: Secondary | ICD-10-CM | POA: Diagnosis not present

## 2015-11-30 DIAGNOSIS — M25511 Pain in right shoulder: Secondary | ICD-10-CM

## 2015-11-30 DIAGNOSIS — Z87891 Personal history of nicotine dependence: Secondary | ICD-10-CM | POA: Diagnosis not present

## 2015-11-30 MED ORDER — CYCLOBENZAPRINE HCL 10 MG PO TABS: 10.0000 mg | ORAL_TABLET | Freq: Two times a day (BID) | ORAL | Status: DC | PRN

## 2015-11-30 MED ORDER — IBUPROFEN 800 MG PO TABS
800.0000 mg | ORAL_TABLET | Freq: Three times a day (TID) | ORAL | Status: DC
Start: 2015-11-30 — End: 2018-03-15

## 2015-11-30 MED ORDER — OXYCODONE-ACETAMINOPHEN 5-325 MG PO TABS
2.0000 | ORAL_TABLET | Freq: Once | ORAL | Status: AC
  Administered 2015-11-30: 2 via ORAL
  Filled 2015-11-30: qty 2

## 2015-11-30 MED ORDER — KETOROLAC TROMETHAMINE 60 MG/2ML IM SOLN
60.0000 mg | Freq: Once | INTRAMUSCULAR | Status: AC
  Administered 2015-11-30: 60 mg via INTRAMUSCULAR
  Filled 2015-11-30: qty 2
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
## 2015-11-30 NOTE — ED Notes (Signed)
Pt states understanding of care given and follow up instructions.  Ambulated from ED  

## 2015-11-30 NOTE — Progress Notes (Signed)
Pts necklace was removed for imaging. Necklace and bra were placed in a Pt belonging bag and given to Pt when imaging was completed.

## 2016-11-11 ENCOUNTER — Emergency Department (HOSPITAL_COMMUNITY)
Admission: EM | Admit: 2016-11-11 | Discharge: 2016-11-11 | Disposition: A | Discharge status: Home / Self Care | Payer: PRIVATE HEALTH INSURANCE | Attending: Dermatology | Admitting: Dermatology

## 2016-11-11 ENCOUNTER — Emergency Department (HOSPITAL_COMMUNITY): Payer: PRIVATE HEALTH INSURANCE

## 2016-11-11 ENCOUNTER — Encounter (HOSPITAL_COMMUNITY): Payer: Self-pay | Admitting: Emergency Medicine

## 2016-11-11 DIAGNOSIS — R05 Cough: Secondary | ICD-10-CM | POA: Diagnosis not present

## 2016-11-11 DIAGNOSIS — Z5321 Procedure and treatment not carried out due to patient leaving prior to being seen by health care provider: Secondary | ICD-10-CM | POA: Insufficient documentation

## 2016-11-11 DIAGNOSIS — R509 Fever, unspecified: Secondary | ICD-10-CM | POA: Diagnosis not present

## 2016-11-11 DIAGNOSIS — Z87891 Personal history of nicotine dependence: Secondary | ICD-10-CM | POA: Insufficient documentation

## 2016-11-11 DIAGNOSIS — R0981 Nasal congestion: Secondary | ICD-10-CM | POA: Insufficient documentation

## 2016-11-11 DIAGNOSIS — J45909 Unspecified asthma, uncomplicated: Secondary | ICD-10-CM | POA: Insufficient documentation

## 2016-11-11 NOTE — ED Triage Notes (Signed)
Patient complains of cough and congestion with nasal drainage x 1 week. Rib pain with cough.

## 2018-03-15 ENCOUNTER — Emergency Department (HOSPITAL_COMMUNITY): Payer: 59

## 2018-03-15 ENCOUNTER — Encounter (HOSPITAL_COMMUNITY): Payer: Self-pay | Admitting: Emergency Medicine

## 2018-03-15 ENCOUNTER — Other Ambulatory Visit: Payer: Self-pay

## 2018-03-15 ENCOUNTER — Emergency Department (HOSPITAL_COMMUNITY)
Admission: EM | Admit: 2018-03-15 | Discharge: 2018-03-15 | Disposition: A | Discharge status: Home / Self Care | Payer: 59 | Attending: Emergency Medicine | Admitting: Emergency Medicine

## 2018-03-15 DIAGNOSIS — Z87891 Personal history of nicotine dependence: Secondary | ICD-10-CM | POA: Insufficient documentation

## 2018-03-15 DIAGNOSIS — M436 Torticollis: Secondary | ICD-10-CM | POA: Insufficient documentation

## 2018-03-15 DIAGNOSIS — M542 Cervicalgia: Secondary | ICD-10-CM | POA: Diagnosis present

## 2018-03-15 DIAGNOSIS — Z79899 Other long term (current) drug therapy: Secondary | ICD-10-CM | POA: Insufficient documentation

## 2018-03-15 DIAGNOSIS — J45909 Unspecified asthma, uncomplicated: Secondary | ICD-10-CM | POA: Insufficient documentation

## 2018-03-15 MED ORDER — CYCLOBENZAPRINE HCL 10 MG PO TABS: 10.0000 mg | ORAL_TABLET | Freq: Two times a day (BID) | ORAL | 0 refills | Status: DC | PRN

## 2018-03-15 MED ORDER — IBUPROFEN 800 MG PO TABS: 800.0000 mg | ORAL_TABLET | Freq: Three times a day (TID) | ORAL | 0 refills | Status: DC | PRN

## 2018-03-15 MED ORDER — PREDNISONE 20 MG PO TABS: ORAL_TABLET | ORAL | 0 refills | Status: DC
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
## 2018-03-15 NOTE — Discharge Instructions (Signed)
Wear soft collar as needed for support.  Do not wear for more than a week.  Take medications as prescribed.  Follow up with orthopedist specialist if you notice no improvement.

## 2018-11-13 ENCOUNTER — Emergency Department (HOSPITAL_COMMUNITY)
Admission: EM | Admit: 2018-11-13 | Discharge: 2018-11-13 | Disposition: A | Discharge status: Home / Self Care | Payer: 59 | Attending: Emergency Medicine | Admitting: Emergency Medicine

## 2018-11-13 ENCOUNTER — Emergency Department (HOSPITAL_COMMUNITY): Payer: 59

## 2018-11-13 ENCOUNTER — Other Ambulatory Visit: Payer: Self-pay

## 2018-11-13 ENCOUNTER — Encounter (HOSPITAL_COMMUNITY): Payer: Self-pay | Admitting: Emergency Medicine

## 2018-11-13 DIAGNOSIS — K2900 Acute gastritis without bleeding: Secondary | ICD-10-CM | POA: Diagnosis not present

## 2018-11-13 DIAGNOSIS — R1013 Epigastric pain: Secondary | ICD-10-CM | POA: Diagnosis present

## 2018-11-13 DIAGNOSIS — Z79899 Other long term (current) drug therapy: Secondary | ICD-10-CM | POA: Diagnosis not present

## 2018-11-13 DIAGNOSIS — Z87891 Personal history of nicotine dependence: Secondary | ICD-10-CM | POA: Diagnosis not present

## 2018-11-13 DIAGNOSIS — J45909 Unspecified asthma, uncomplicated: Secondary | ICD-10-CM | POA: Insufficient documentation

## 2018-11-13 LAB — COMPREHENSIVE METABOLIC PANEL
ALK PHOS: 61 U/L (ref 38–126)
ALT: 42 U/L (ref 0–44)
ANION GAP: 10 (ref 5–15)
AST: 31 U/L (ref 15–41)
Albumin: 4.1 g/dL (ref 3.5–5.0)
BILIRUBIN TOTAL: 0.6 mg/dL (ref 0.3–1.2)
BUN: 11 mg/dL (ref 6–20)
CALCIUM: 9.6 mg/dL (ref 8.9–10.3)
CO2: 21 mmol/L — ABNORMAL LOW (ref 22–32)
Chloride: 104 mmol/L (ref 98–111)
Creatinine, Ser: 0.55 mg/dL (ref 0.44–1.00)
GFR calc Af Amer: >60 mL/min (ref >60)
Glucose, Bld: 91 mg/dL (ref 70–99)
Potassium: 3.7 mmol/L (ref 3.5–5.1)
Sodium: 135 mmol/L (ref 135–145)
TOTAL PROTEIN: 8 g/dL (ref 6.5–8.1)

## 2018-11-13 LAB — CBC
HCT: 44.7 % (ref 36.0–46.0)
Hemoglobin: 14.3 g/dL (ref 12.0–15.0)
MCH: 27.9 pg (ref 26.0–34.0)
MCHC: 32 g/dL (ref 30.0–36.0)
MCV: 87.1 fL (ref 80.0–100.0)
NRBC: 0 % (ref 0.0–0.2)
Platelets: 343 10*3/uL (ref 150–400)
RBC: 5.13 MIL/uL — AB (ref 3.87–5.11)
RDW: 12.7 % (ref 11.5–15.5)
WBC: 10.9 10*3/uL — AB (ref 4.0–10.5)

## 2018-11-13 LAB — URINALYSIS, ROUTINE W REFLEX MICROSCOPIC
BILIRUBIN URINE: NEGATIVE
GLUCOSE, UA: NEGATIVE mg/dL
HGB URINE DIPSTICK: NEGATIVE
KETONES UR: NEGATIVE mg/dL
Leukocytes,Ua: NEGATIVE
NITRITE: NEGATIVE
PH: 6 (ref 5.0–8.0)
Protein, ur: NEGATIVE mg/dL
Specific Gravity, Urine: 1.02 (ref 1.005–1.030)

## 2018-11-13 LAB — PREGNANCY, URINE: Preg Test, Ur: NEGATIVE

## 2018-11-13 LAB — LIPASE, BLOOD: Lipase: 18 U/L (ref 11–51)

## 2018-11-13 MED ORDER — ALUM & MAG HYDROXIDE-SIMETH 200-200-20 MG/5ML PO SUSP
30.0000 mL | Freq: Once | ORAL | Status: AC
  Administered 2018-11-13: 30 mL via ORAL
  Filled 2018-11-13: qty 30

## 2018-11-13 MED ORDER — SODIUM CHLORIDE 0.9% FLUSH: 3.0000 mL | Freq: Once | INTRAVENOUS | Status: DC

## 2018-11-13 MED ORDER — ONDANSETRON HCL 4 MG PO TABS: 4.0000 mg | ORAL_TABLET | Freq: Four times a day (QID) | ORAL | 0 refills | Status: DC | PRN

## 2018-11-13 MED ORDER — SUCRALFATE 1 G PO TABS: 1.0000 g | ORAL_TABLET | Freq: Three times a day (TID) | ORAL | 0 refills | Status: DC

## 2018-11-13 MED ORDER — ONDANSETRON 4 MG PO TBDP
4.0000 mg | ORAL_TABLET | Freq: Once | ORAL | Status: AC
  Administered 2018-11-13: 4 mg via ORAL
  Filled 2018-11-13: qty 1

## 2018-11-13 MED ORDER — PANTOPRAZOLE SODIUM 20 MG PO TBEC: 20.0000 mg | DELAYED_RELEASE_TABLET | Freq: Two times a day (BID) | ORAL | 0 refills | Status: DC

## 2018-11-13 MED ORDER — LIDOCAINE VISCOUS HCL 2 % MT SOLN
15.0000 mL | Freq: Once | OROMUCOSAL | Status: AC
  Administered 2018-11-13: 15 mL via ORAL
  Filled 2018-11-13: qty 15

## 2018-11-13 MED ORDER — SUCRALFATE 1 G PO TABS
1.0000 g | ORAL_TABLET | Freq: Once | ORAL | Status: AC
  Administered 2018-11-13: 1 g via ORAL
  Filled 2018-11-13: qty 1

## 2018-11-13 NOTE — ED Triage Notes (Signed)
Patient reports projectile vomiting that started this am. Patient states she has had emesis intermittently for several months. Also reports edema in her hands that started several months ago and is intermittent.

## 2018-11-13 NOTE — ED Provider Notes (Signed)
[facility] EMERGENCY DEPARTMENT Provider Note   CSN: [id number] Arrival date & time: 11/13/18  1117     History   Chief Complaint Chief Complaint  Patient presents with  . Emesis    HPI Krystal Porter is a 28 y.o. female.  HPI Patient states she has had previous peptic ulcer disease.  Is not on PPI currently.  States has had 1 month of intermittent epigastric pain associated with nausea and vomiting.  Admits to eating spicy foods but denies NSAID use.  States she is vomited 4 times this morning has burning sensation that comes up into her chest and throat.  Denies any fever or chills.  Denies grossly bloody or melanotic stools. Past Medical History:  Diagnosis Date  . Amenorrhea 03/13/2013  . Asthma   . LLQ pain 11/09/2013   She says she feels mass, I did not but she is tender will get [place]  . Patient desires pregnancy 07/14/2013  . PCO (polycystic ovaries) 03/27/2013    Patient Active Problem List   Diagnosis Date Noted  . LLQ pain 11/09/2013  . Patient desires pregnancy 07/14/2013  . PCO (polycystic ovaries) 03/27/2013  . Unspecified symptom associated with female genital organs 03/13/2013  . Amenorrhea 03/13/2013    Past Surgical History:  Procedure Laterality Date  . NO PAST SURGERIES       OB History    Gravida      Para      Term      Preterm      AB      Living  0     SAB      TAB      Ectopic      Multiple      Live Births               Home Medications    Prior to Admission medications   Medication Sig Start Date End Date Taking? Authorizing Provider  albuterol (PROVENTIL HFA;VENTOLIN HFA) 108 (90 BASE) MCG/ACT inhaler Inhale into the lungs every 6 (six) hours as needed for wheezing or shortness of breath.   Yes [provider]  ondansetron (ZOFRAN) 4 MG tablet Take 1 tablet (4 mg total) by mouth every 6 (six) hours as needed for nausea or vomiting. 11/13/18   Loren Racer, MD  pantoprazole (PROTONIX) 20 MG tablet Take 1  tablet (20 mg total) by mouth 2 (two) times daily. 11/13/18   Loren Racer, MD  sucralfate (CARAFATE) 1 g tablet Take 1 tablet (1 g total) by mouth 4 (four) times daily -  with meals and at bedtime. 11/13/18   Loren Racer, MD    Family History Family History  Problem Relation Age of Onset  . Thyroid disease Father   . Cancer Maternal Grandmother        ovarian  . Cancer Maternal Aunt        ovarian    Social History Social History   Tobacco Use  . Smoking status: Former Smoker    Types: Cigarettes  . Smokeless tobacco: Never Used  Substance Use Topics  . Alcohol use: No  . Drug use: No     Allergies   Patient has no known allergies.   Review of Systems Review of Systems  Constitutional: Negative for chills and fever.  HENT: Positive for sore throat. Negative for trouble swallowing.   Eyes: Negative for visual disturbance.  Respiratory: Negative for cough and shortness of breath.   Cardiovascular: Negative for chest  pain.  Gastrointestinal: Positive for abdominal pain, nausea and vomiting. Negative for constipation and diarrhea.  Genitourinary: Negative for dysuria, flank pain, frequency and hematuria.  Musculoskeletal: Negative for back pain, myalgias and neck pain.  Skin: Negative for rash and wound.  Neurological: Negative for dizziness, weakness, light-headedness, numbness and headaches.  All other systems reviewed and are negative.    Physical Exam Updated Vital Signs BP 134/83 (BP Location: Right Arm)   Pulse 96   Temp 98 F (36.7 C) (Oral)   Resp 18   Ht 5\' 4"  (1.626 m)   Wt 108.4 kg   SpO2 95%   BMI 41.02 kg/m   Physical Exam Vitals signs and nursing note reviewed.  Constitutional:      Appearance: Normal appearance. She is well-developed.  HENT:     Head: Normocephalic and atraumatic.     Mouth/Throat:     Mouth: Mucous membranes are moist.     Pharynx: No oropharyngeal exudate or posterior oropharyngeal erythema.  Eyes:      Extraocular Movements: Extraocular movements intact.     Pupils: Pupils are equal, round, and reactive to light.  Neck:     Musculoskeletal: Normal range of motion and neck supple. No neck rigidity or muscular tenderness.  Cardiovascular:     Rate and Rhythm: Normal rate and regular rhythm.     Heart sounds: No murmur. No friction rub. No gallop.   Pulmonary:     Effort: Pulmonary effort is normal. No respiratory distress.     Breath sounds: Normal breath sounds. No stridor. No wheezing, rhonchi or rales.  Chest:     Chest wall: No tenderness.  Abdominal:     General: Bowel sounds are normal.     Palpations: Abdomen is soft.     Tenderness: There is abdominal tenderness. There is no right CVA tenderness, left CVA tenderness, guarding or rebound.     Comments: Epigastric tenderness to palpation.  No rebound or guarding.  Musculoskeletal: Normal range of motion.        General: No swelling, tenderness, deformity or signs of injury.     Right lower leg: No edema.     Left lower leg: No edema.     Comments: No midline thoracic or lumbar tenderness.  No CVA tenderness.  No lower extremity swelling, asymmetry or tenderness.  Mild soft tissue swelling of bilateral hands without pitting edema.  Distal pulses intact.  Lymphadenopathy:     Cervical: No cervical adenopathy.  Skin:    General: Skin is warm and dry.     Capillary Refill: Capillary refill takes less than 2 seconds.     Findings: No erythema or rash.  Neurological:     General: No focal deficit present.     Mental Status: She is alert and oriented to person, place, and time.  Psychiatric:        Behavior: Behavior normal.      ED Treatments / Results  Labs (all labs ordered are listed, but only abnormal results are displayed) Labs Reviewed  COMPREHENSIVE METABOLIC PANEL - Abnormal; Notable for the following components:      Result Value   CO2 21 (*)    All other components within normal limits  CBC - Abnormal; Notable  for the following components:   WBC 10.9 (*)    RBC 5.13 (*)    All other components within normal limits  LIPASE, BLOOD  URINALYSIS, ROUTINE W REFLEX MICROSCOPIC  PREGNANCY, URINE    EKG None  Radiology US Abdomen Limited  Result Date: 11/13/2018 CLINICAL DATA:  Upper abdominal pain with vomiting.  Morbid obesity. EXAM: ULTRASOUND ABDOMEN LIMITED RIGHT UPPER QUADRANT COMPARISON:  CT abdomen and pelvis April 23, 2015 FINDINGS: Gallbladder: No gallstones or wall thickening visualized. There is no pericholecystic fluid. No sonographic Murphy sign noted by sonographer. Common bile duct: Diameter: 3 mm. No intrahepatic or extrahepatic biliary duct dilatation. Liver: No focal lesion identified. Liver echogenicity is increased diffusely. Portal vein is patent on color Doppler imaging with normal direction of blood flow towards the liver. IMPRESSION: Diffuse increase in liver echogenicity, a finding indicative of hepatic steatosis. While no focal liver lesions are identified on this study, it must be cautioned that the sensitivity of ultrasound for detection of focal liver lesions is diminished significantly in this circumstance. Study otherwise unremarkable. Electronically Signed   By: Bretta Bang III M.D.   On: 11/13/2018 14:55    Procedures Procedures (including critical care time)  Medications Ordered in ED Medications  sodium chloride flush (NS) 0.9 % injection 3 mL (has no administration in time range)  sucralfate (CARAFATE) tablet 1 g (has no administration in time range)  alum & mag hydroxide-simeth (MAALOX/MYLANTA) 200-200-20 MG/5ML suspension 30 mL (30 mLs Oral Given 11/13/18 1429)    And  lidocaine (XYLOCAINE) 2 % viscous mouth solution 15 mL (15 mLs Oral Given 11/13/18 1429)  ondansetron (ZOFRAN-ODT) disintegrating tablet 4 mg (4 mg Oral Given 11/13/18 1428)     Initial Impression / Assessment and Plan / ED Course  I have reviewed the triage vital signs and the nursing  notes.  Pertinent labs & imaging results that were available during my care of the patient were reviewed by me and considered in my medical decision making (see chart for details).     Patient states that her symptoms improved after GI cocktail.  She is having mild returning of her upper abdominal pain.  Given dose of Carafate.  Gallbladder ultrasound without acute findings.  Suspect patient has gastritis with reflux disease.  Will start back on PPI and have follow-up with gastroenterology.  Advised dietary modifications and avoidance of NSAIDs.  Return precautions given.  Final Clinical Impressions(s) / ED Diagnoses   Final diagnoses:  Acute gastritis without hemorrhage, unspecified gastritis type    ED Discharge Orders         Ordered    pantoprazole (PROTONIX) 20 MG tablet  2 times daily     11/13/18 1615    ondansetron (ZOFRAN) 4 MG tablet  Every 6 hours PRN     11/13/18 1615    sucralfate (CARAFATE) 1 g tablet  3 times daily with meals & bedtime     11/13/18 1615           Loren Racer, MD 11/13/18 1616

## 2018-11-18 ENCOUNTER — Encounter: Payer: Self-pay | Admitting: Gastroenterology

## 2019-01-23 ENCOUNTER — Other Ambulatory Visit: Payer: Self-pay

## 2019-01-23 ENCOUNTER — Telehealth: Payer: Self-pay | Admitting: *Deleted

## 2019-01-23 ENCOUNTER — Encounter: Payer: Self-pay | Admitting: Gastroenterology

## 2019-01-23 ENCOUNTER — Ambulatory Visit: Payer: PRIVATE HEALTH INSURANCE | Admitting: Gastroenterology

## 2019-01-23 ENCOUNTER — Telehealth: Payer: Self-pay | Admitting: Gastroenterology

## 2019-01-23 NOTE — Telephone Encounter (Signed)
PATIENT WAS A NO SHOW/NO ANSWER AND LETTER SENT  °

## 2019-01-23 NOTE — Telephone Encounter (Signed)
Called patient at 9:30am and had to leave message to call back. Patient was scheduled for virtual visit with [facility]. Please No Show. Thanks

## 2019-01-26 NOTE — Telephone Encounter (Signed)
No showed and mailed letter

## 2019-08-11 ENCOUNTER — Encounter (HOSPITAL_COMMUNITY): Payer: Self-pay | Admitting: Emergency Medicine

## 2019-08-11 ENCOUNTER — Emergency Department (HOSPITAL_COMMUNITY): Payer: 59

## 2019-08-11 ENCOUNTER — Emergency Department (HOSPITAL_COMMUNITY)
Admission: EM | Admit: 2019-08-11 | Discharge: 2019-08-11 | Disposition: A | Discharge status: Home / Self Care | Payer: 59 | Attending: Emergency Medicine | Admitting: Emergency Medicine

## 2019-08-11 ENCOUNTER — Other Ambulatory Visit: Payer: Self-pay

## 2019-08-11 DIAGNOSIS — Y999 Unspecified external cause status: Secondary | ICD-10-CM | POA: Diagnosis not present

## 2019-08-11 DIAGNOSIS — Z87891 Personal history of nicotine dependence: Secondary | ICD-10-CM | POA: Diagnosis not present

## 2019-08-11 DIAGNOSIS — S93402A Sprain of unspecified ligament of left ankle, initial encounter: Secondary | ICD-10-CM | POA: Insufficient documentation

## 2019-08-11 DIAGNOSIS — W19XXXA Unspecified fall, initial encounter: Secondary | ICD-10-CM | POA: Diagnosis not present

## 2019-08-11 DIAGNOSIS — S99912A Unspecified injury of left ankle, initial encounter: Secondary | ICD-10-CM | POA: Diagnosis present

## 2019-08-11 DIAGNOSIS — J45909 Unspecified asthma, uncomplicated: Secondary | ICD-10-CM | POA: Diagnosis not present

## 2019-08-11 DIAGNOSIS — Z79899 Other long term (current) drug therapy: Secondary | ICD-10-CM | POA: Diagnosis not present

## 2019-08-11 DIAGNOSIS — Y9301 Activity, walking, marching and hiking: Secondary | ICD-10-CM | POA: Diagnosis not present

## 2019-08-11 DIAGNOSIS — Y929 Unspecified place or not applicable: Secondary | ICD-10-CM | POA: Diagnosis not present

## 2019-08-11 MED ORDER — KETOROLAC TROMETHAMINE 10 MG PO TABS
10.0000 mg | ORAL_TABLET | Freq: Once | ORAL | Status: AC
Start: 2019-08-11 — End: 2019-08-11
  Administered 2019-08-11: 10 mg via ORAL
  Filled 2019-08-11: qty 1

## 2019-08-11 MED ORDER — IBUPROFEN 600 MG PO TABS: 600.0000 mg | ORAL_TABLET | Freq: Four times a day (QID) | ORAL | 0 refills | Status: DC

## 2019-08-11 MED ORDER — ONDANSETRON HCL 4 MG PO TABS
4.0000 mg | ORAL_TABLET | Freq: Once | ORAL | Status: AC
  Administered 2019-08-11: 4 mg via ORAL
  Filled 2019-08-11: qty 1

## 2019-08-11 MED ORDER — HYDROCODONE-ACETAMINOPHEN 5-325 MG PO TABS
2.0000 | ORAL_TABLET | Freq: Once | ORAL | Status: AC
  Administered 2019-08-11: 2 via ORAL
  Filled 2019-08-11: qty 2

## 2019-08-11 NOTE — ED Triage Notes (Signed)
LT ankle pain after a fall today.  Swelling and bruising to the area

## 2019-08-11 NOTE — Discharge Instructions (Addendum)
Your x-ray is negative for fracture.  There is evidence of possible ligament injury.  Please use your boot until seen by orthopedic specialist.  Please see Dr. Aline Brochure, or the orthopedic specialist of your choice.  Use ibuprofen with breakfast, lunch, dinner, and at bedtime.  Apply ice over the next couple of days.

## 2019-08-11 NOTE — ED Provider Notes (Signed)
[facility] EMERGENCY DEPARTMENT Provider Note   CSN: [id number] Arrival date & time: 08/11/19  1315     History   Chief Complaint Chief Complaint  Patient presents with  . Ankle Pain    HPI Krystal Porter is a 28 y.o. female.     The history is provided by the patient.  Ankle Pain Location:  Ankle Injury: yes   Mechanism of injury: fall   Fall:    Fall occurred:  Walking and standing   Entrapped after fall: no   Ankle location:  L ankle Pain details:    Quality:  Throbbing   Radiates to:  Does not radiate   Severity:  Moderate   Onset quality:  Sudden   Duration:  2 hours   Timing:  Constant   Progression:  Worsening Chronicity:  New Foreign body present:  No foreign bodies Prior injury to area:  No Relieved by:  Nothing Worsened by:  Bearing weight Ineffective treatments:  None tried Associated symptoms: numbness and swelling   Associated symptoms: no back pain and no neck pain   Risk factors: no frequent fractures, no known bone disorder and no recent illness     Past Medical History:  Diagnosis Date  . Amenorrhea 03/13/2013  . Asthma   . LLQ pain 11/09/2013   She says she feels mass, I did not but she is tender will get [place]  . Patient desires pregnancy 07/14/2013  . PCO (polycystic ovaries) 03/27/2013    Patient Active Problem List   Diagnosis Date Noted  . LLQ pain 11/09/2013  . Patient desires pregnancy 07/14/2013  . PCO (polycystic ovaries) 03/27/2013  . Unspecified symptom associated with female genital organs 03/13/2013  . Amenorrhea 03/13/2013    Past Surgical History:  Procedure Laterality Date  . NO PAST SURGERIES       OB History    Gravida      Para      Term      Preterm      AB      Living  0     SAB      TAB      Ectopic      Multiple      Live Births               Home Medications    Prior to Admission medications   Medication Sig Start Date End Date Taking? Authorizing Provider  albuterol  (PROVENTIL HFA;VENTOLIN HFA) 108 (90 BASE) MCG/ACT inhaler Inhale into the lungs every 6 (six) hours as needed for wheezing or shortness of breath.    [provider]  ondansetron (ZOFRAN) 4 MG tablet Take 1 tablet (4 mg total) by mouth every 6 (six) hours as needed for nausea or vomiting. 11/13/18   Julianne Rice, MD  pantoprazole (PROTONIX) 20 MG tablet Take 1 tablet (20 mg total) by mouth 2 (two) times daily. 11/13/18   Julianne Rice, MD  sucralfate (CARAFATE) 1 g tablet Take 1 tablet (1 g total) by mouth 4 (four) times daily -  with meals and at bedtime. 11/13/18   Julianne Rice, MD    Family History Family History  Problem Relation Age of Onset  . Thyroid disease Father   . Cancer Maternal Grandmother        ovarian  . Cancer Maternal Aunt        ovarian    Social History Social History   Tobacco Use  . Smoking status: Former Smoker  Types: Cigarettes  . Smokeless tobacco: Never Used  Substance Use Topics  . Alcohol use: No  . Drug use: No     Allergies   Patient has no known allergies.   Review of Systems Review of Systems  Constitutional: Negative for activity change and appetite change.  HENT: Negative for congestion, ear discharge, ear pain, facial swelling, nosebleeds, rhinorrhea, sneezing and tinnitus.   Eyes: Negative for photophobia, pain and discharge.  Respiratory: Negative for cough, choking, shortness of breath and wheezing.   Cardiovascular: Negative for chest pain, palpitations and leg swelling.  Gastrointestinal: Negative for abdominal pain, blood in stool, constipation, diarrhea, nausea and vomiting.  Genitourinary: Negative for difficulty urinating, dysuria, flank pain, frequency and hematuria.  Musculoskeletal: Positive for arthralgias. Negative for back pain, gait problem, myalgias and neck pain.  Skin: Negative for color change, rash and wound.  Neurological: Negative for dizziness, seizures, syncope, facial asymmetry, speech  difficulty, weakness and numbness.  Hematological: Negative for adenopathy. Does not bruise/bleed easily.  Psychiatric/Behavioral: Negative for agitation, confusion, hallucinations, self-injury and suicidal ideas. The patient is not nervous/anxious.      Physical Exam Updated Vital Signs BP 118/73 (BP Location: Right Arm)   Pulse 80   Temp 98.2 F (36.8 C) (Oral)   Resp (!) 22   Ht  (1.6 m)   Wt 106.6 kg   SpO2 97%   BMI 41.63 kg/m   Physical Exam Vitals signs and nursing note reviewed.  Constitutional:      Appearance: She is well-developed. She is not toxic-appearing.  HENT:     Head: Normocephalic.     Right Ear: Tympanic membrane and external ear normal.     Left Ear: Tympanic membrane and external ear normal.  Eyes:     General: Lids are normal.     Pupils: Pupils are equal, round, and reactive to light.  Neck:     Musculoskeletal: Normal range of motion and neck supple.     Vascular: No carotid bruit.  Cardiovascular:     Rate and Rhythm: Normal rate and regular rhythm.     Pulses: Normal pulses.     Heart sounds: Normal heart sounds.  Pulmonary:     Effort: No respiratory distress.     Breath sounds: Normal breath sounds.  Abdominal:     General: Bowel sounds are normal.     Palpations: Abdomen is soft.     Tenderness: There is no abdominal tenderness. There is no guarding.  Musculoskeletal: Normal range of motion.        General: Tenderness present.       Feet:     Comments: Patient complains of a numbing sensation of her toes, but has good capillary refill, dorsalis pedis pulses are 2+.  No visible deformity with exception of some swelling of the lateral malleolus area.  Achilles tendon is intact.  No hematoma or deformity of the anterior tibial area.  Lymphadenopathy:     Head:     Right side of head: No submandibular adenopathy.     Left side of head: No submandibular adenopathy.     Cervical: No cervical adenopathy.  Skin:    General: Skin is  warm and dry.     Capillary Refill: Capillary refill takes less than 2 seconds.  Neurological:     Mental Status: She is alert and oriented to person, place, and time.     Cranial Nerves: No cranial nerve deficit.     Sensory: No sensory deficit.  Psychiatric:        Speech: Speech normal.      ED Treatments / Results  Labs (all labs ordered are listed, but only abnormal results are displayed) Labs Reviewed - No data to display  EKG None  Radiology No results found.  Procedures Procedures (including critical care time)  Medications Ordered in ED Medications - No data to display   Initial Impression / Assessment and Plan / ED Course  I have reviewed the triage vital signs and the nursing notes.  Pertinent labs & imaging results that were available during my care of the patient were reviewed by me and considered in my medical decision making (see chart for details).          Final Clinical Impressions(s) / ED Diagnoses MDM  Patient sustained a fall from a standing walking position, and injured the left ankle.  There are no neurovascular deficits appreciated on examination.  The x-ray is negative for fracture or dislocation.  There is noted some widening of the joint space laterally, and there is question of ligament injury.  Patient is fitted with a Cam walker.  She is given ice to use.  The patient will use ibuprofen with breakfast, lunch, dinner, and at bedtime.  Patient is referred to Dr. Romeo Apple for orthopedic follow-up.   Final diagnoses:  Sprain of left ankle, unspecified ligament, initial encounter    ED Discharge Orders         Ordered    ibuprofen (ADVIL) 600 MG tablet  4 times daily     08/11/19 1611           Ivery Quale, PA-C 08/11/19 1621    Pricilla Loveless, MD 08/13/19 1600

## 2019-08-13 ENCOUNTER — Encounter: Payer: Self-pay | Admitting: Orthopaedic Surgery

## 2019-08-13 ENCOUNTER — Ambulatory Visit: Payer: 59 | Admitting: Orthopaedic Surgery

## 2019-08-13 ENCOUNTER — Other Ambulatory Visit: Payer: Self-pay

## 2019-08-13 VITALS — BP 152/72 | HR 102 | Ht 63.0 in | Wt 230.0 lb

## 2019-08-13 DIAGNOSIS — S96912A Strain of unspecified muscle and tendon at ankle and foot level, left foot, initial encounter: Secondary | ICD-10-CM | POA: Diagnosis not present

## 2019-08-13 NOTE — Progress Notes (Signed)
Subjective:    Patient ID: Krystal Porter, female    DOB: 1990/12/12, 28 y.o.   MRN: [id number]  HPI She fell and hurt her left ankle on 08-11-2019. She went to the ER.  X-rays were done and showed: IMPRESSION: Negative for fracture. Mild widening of the joint space laterally may reflect ligament injury.  She was given crutches and CAM walker. The CAM walker is not fitting well. She has no other injury.  She has swelling more laterally. She is taking Advil for pain which controls her pain.  She has no redness.  I have reviewed the ER record, the x-rays and report.   Review of Systems  Constitutional: Positive for activity change.  Respiratory: Positive for shortness of breath.   Musculoskeletal: Positive for arthralgias, gait problem and joint swelling.  All other systems reviewed and are negative.  For Review of Systems, all other systems reviewed and are negative.  The following is a summary of the past history medically, past history surgically, known current medicines, social history and family history.  This information is gathered electronically by the computer from prior information and documentation.  I review this each visit and have found including this information at this point in the chart is beneficial and informative.   Past Medical History:  Diagnosis Date  . Amenorrhea 03/13/2013  . Asthma   . LLQ pain 11/09/2013   She says she feels mass, I did not but she is tender will get [place]  . Patient desires pregnancy 07/14/2013  . PCO (polycystic ovaries) 03/27/2013    Past Surgical History:  Procedure Laterality Date  . NO PAST SURGERIES      Current Outpatient Medications on File Prior to Visit  Medication Sig Dispense Refill  . albuterol (PROVENTIL HFA;VENTOLIN HFA) 108 (90 BASE) MCG/ACT inhaler Inhale into the lungs every 6 (six) hours as needed for wheezing or shortness of breath.    Marland Kitchen ibuprofen (ADVIL) 600 MG tablet Take 1 tablet (600 mg total) by mouth 4 (four)  times daily. 30 tablet 0  . pantoprazole (PROTONIX) 20 MG tablet Take 1 tablet (20 mg total) by mouth 2 (two) times daily. 30 tablet 0   No current facility-administered medications on file prior to visit.     Social History   Socioeconomic History  . Marital status: Married    Spouse name: Not on file  . Number of children: Not on file  . Years of education: Not on file  . Highest education level: Not on file  Occupational History  . Not on file  Social Needs  . Financial resource strain: Not on file  . Food insecurity    Worry: Not on file    Inability: Not on file  . Transportation needs    Medical: Not on file    Non-medical: Not on file  Tobacco Use  . Smoking status: Former Smoker    Types: Cigarettes  . Smokeless tobacco: Never Used  Substance and Sexual Activity  . Alcohol use: No  . Drug use: No  . Sexual activity: Yes    Birth control/protection: None  Lifestyle  . Physical activity    Days per week: Not on file    Minutes per session: Not on file  . Stress: Not on file  Relationships  . Social Herbalist on phone: Not on file    Gets together: Not on file    Attends religious service: Not on file    Active  member of club or organization: Not on file    Attends meetings of clubs or organizations: Not on file    Relationship status: Not on file  . Intimate partner violence    Fear of current or ex partner: Not on file    Emotionally abused: Not on file    Physically abused: Not on file    Forced sexual activity: Not on file  Other Topics Concern  . Not on file  Social History Narrative  . Not on file    Family History  Problem Relation Age of Onset  . Thyroid disease Father   . Cancer Maternal Grandmother        ovarian  . Cancer Maternal Aunt        ovarian    BP (!) 152/72   Pulse (!) 102   Ht 5\' 3"  (1.6 m)   Wt 230 lb (104.3 kg)   BMI 40.74 kg/m   Body mass index is 40.74 kg/m.     Objective:   Physical Exam Vitals  signs reviewed.  Constitutional:      Appearance: She is well-developed.  HENT:     Head: Normocephalic and atraumatic.  Eyes:     Conjunctiva/sclera: Conjunctivae normal.     Pupils: Pupils are equal, round, and reactive to light.  Neck:     Musculoskeletal: Normal range of motion and neck supple.  Cardiovascular:     Rate and Rhythm: Normal rate and regular rhythm.  Pulmonary:     Effort: Pulmonary effort is normal.  Abdominal:     Palpations: Abdomen is soft.  Musculoskeletal:       Feet:  Skin:    General: Skin is warm and dry.  Neurological:     Mental Status: She is alert and oriented to person, place, and time.     Cranial Nerves: No cranial nerve deficit.     Motor: No abnormal muscle tone.     Coordination: Coordination normal.     Deep Tendon Reflexes: Reflexes are normal and symmetric. Reflexes normal.  Psychiatric:        Behavior: Behavior normal.        Thought Content: Thought content normal.        Judgment: Judgment normal.           Assessment & Plan:   Encounter Diagnosis  Name Primary?  . Strain of left ankle, initial encounter Yes   I have fitted her with a new CAM walker, more form fitting.  I have given instructions for Contrast Baths with sheet and to use Aspercreme or BioFreeze or Voltaren Gel.  Use the crutches.  Elevate.  Return in one week.  Call if any problem.  X-rays on return of the left ankle.  Electronically Signed , MD 11/12/20209:28 AM

## 2019-08-20 ENCOUNTER — Ambulatory Visit: Payer: 59 | Admitting: Orthopaedic Surgery

## 2019-09-01 ENCOUNTER — Other Ambulatory Visit: Payer: Self-pay

## 2019-09-01 ENCOUNTER — Ambulatory Visit: Payer: 59 | Admitting: Orthopaedic Surgery

## 2019-09-01 ENCOUNTER — Encounter: Payer: Self-pay | Admitting: Orthopaedic Surgery

## 2019-09-01 VITALS — BP 117/88 | HR 82 | Temp 97.1°F | Ht 63.0 in | Wt 234.0 lb

## 2019-09-01 DIAGNOSIS — S96912D Strain of unspecified muscle and tendon at ankle and foot level, left foot, subsequent encounter: Secondary | ICD-10-CM | POA: Diagnosis not present

## 2019-09-01 NOTE — Progress Notes (Signed)
Patient Krystal Porter, female DOB:12-19-1990, 28 y.o. NLZ:[id number]  Chief Complaint  Patient presents with  . Ankle Pain    L/alittle sore but better    HPI  Krystal Porter is a 28 y.o. female who has strain of the left ankle.  She has been using the CAM walker. She is better but still has tenderness of the ankle.  I have told her to gradually come out of the CAM walker at home but use it outdoors.  She has no redness, no new trauma.   Body mass index is 41.45 kg/m.  The patient meets the AMA guidelines for Morbid (severe) obesity with a BMI > 40.0 and I have recommended weight loss.   ROS  Review of Systems  Constitutional: Positive for activity change.  Respiratory: Positive for shortness of breath.   Musculoskeletal: Positive for arthralgias, gait problem and joint swelling.  All other systems reviewed and are negative.   All other systems reviewed and are negative.  The following is a summary of the past history medically, past history surgically, known current medicines, social history and family history.  This information is gathered electronically by the computer from prior information and documentation.  I review this each visit and have found including this information at this point in the chart is beneficial and informative.    Past Medical History:  Diagnosis Date  . Amenorrhea 03/13/2013  . Asthma   . LLQ pain 11/09/2013   She says she feels mass, I did not but she is tender will get [place]  . Patient desires pregnancy 07/14/2013  . PCO (polycystic ovaries) 03/27/2013    Past Surgical History:  Procedure Laterality Date  . NO PAST SURGERIES      Family History  Problem Relation Age of Onset  . Thyroid disease Father   . Cancer Maternal Grandmother        ovarian  . Cancer Maternal Aunt        ovarian    Social History Social History   Tobacco Use  . Smoking status: Former Smoker    Types: Cigarettes  . Smokeless tobacco: Never Used  Substance Use  Topics  . Alcohol use: No  . Drug use: No    No Known Allergies  Current Outpatient Medications  Medication Sig Dispense Refill  . albuterol (PROVENTIL HFA;VENTOLIN HFA) 108 (90 BASE) MCG/ACT inhaler Inhale into the lungs every 6 (six) hours as needed for wheezing or shortness of breath.    Marland Kitchen ibuprofen (ADVIL) 600 MG tablet Take 1 tablet (600 mg total) by mouth 4 (four) times daily. 30 tablet 0  . pantoprazole (PROTONIX) 20 MG tablet Take 1 tablet (20 mg total) by mouth 2 (two) times daily. 30 tablet 0   No current facility-administered medications for this visit.      Physical Exam  Blood pressure 117/88, pulse 82, temperature (!) 97.1 F (36.2 C), height 5\' 3"  (1.6 m), weight 234 lb (106.1 kg).  Constitutional: overall normal hygiene, normal nutrition, well developed, normal grooming, normal body habitus. Assistive device:CAM walker left  Musculoskeletal: gait and station Limp left, muscle tone and strength are normal, no tremors or atrophy is present.  .  Neurological: coordination overall normal.  Deep tendon reflex/nerve stretch intact.  Sensation normal.  Cranial nerves II-XII intact.   Skin:   Normal overall no scars, lesions, ulcers or rashes. No psoriasis.  Psychiatric: Alert and oriented x 3.  Recent memory intact, remote memory unclear.  Normal mood and affect.  Well groomed.  Good eye contact.  Cardiovascular: overall no swelling, no varicosities, no edema bilaterally, normal temperatures of the legs and arms, no clubbing, cyanosis and good capillary refill.  Lymphatic: palpation is normal.  Left ankle has some slight tenderness, NV ok, ROM full.  All other systems reviewed and are negative   The patient has been educated about the nature of the problem(s) and counseled on treatment options.  The patient appeared to understand what I have discussed and is in agreement with it.  Encounter Diagnosis  Name Primary?  . Strain of left ankle, subsequent encounter Yes     PLAN Call if any problems.  Precautions discussed.  Continue current medications.   Return to clinic 3 weeks   Electronically Signed Sanjuana Kava, MD 12/1/202010:07 AM

## 2019-09-01 NOTE — Patient Instructions (Signed)
Try to wean from the boot around the house use boot when in public.

## 2019-10-06 ENCOUNTER — Ambulatory Visit: Payer: 59 | Admitting: Orthopaedic Surgery

## 2019-10-06 ENCOUNTER — Encounter: Payer: Self-pay | Admitting: Orthopaedic Surgery

## 2019-10-27 ENCOUNTER — Emergency Department (HOSPITAL_COMMUNITY)
Admission: EM | Admit: 2019-10-27 | Discharge: 2019-10-27 | Disposition: A | Discharge status: Home / Self Care | Payer: 59 | Attending: Emergency Medicine | Admitting: Emergency Medicine

## 2019-10-27 ENCOUNTER — Encounter (HOSPITAL_COMMUNITY): Payer: Self-pay | Admitting: *Deleted

## 2019-10-27 ENCOUNTER — Other Ambulatory Visit: Payer: Self-pay

## 2019-10-27 DIAGNOSIS — U071 COVID-19: Secondary | ICD-10-CM | POA: Diagnosis not present

## 2019-10-27 DIAGNOSIS — J029 Acute pharyngitis, unspecified: Secondary | ICD-10-CM | POA: Insufficient documentation

## 2019-10-27 DIAGNOSIS — R05 Cough: Secondary | ICD-10-CM | POA: Diagnosis present

## 2019-10-27 LAB — POC SARS CORONAVIRUS 2 AG -  ED: SARS Coronavirus 2 Ag: POSITIVE — AB

## 2019-10-27 MED ORDER — DM-GUAIFENESIN ER 30-600 MG PO TB12: 1.0000 | ORAL_TABLET | Freq: Two times a day (BID) | ORAL | 1 refills | Status: DC

## 2019-10-27 NOTE — ED Provider Notes (Signed)
[facility] EMERGENCY DEPARTMENT Provider Note   CSN: [id number] Arrival date & time: 10/27/19  1955     History Chief Complaint  Patient presents with  . Influenza    Krystal Porter is a 29 y.o. female.  Patient states she was exposed to in-laws almost two weeks ago who subsequently tested positive for COVID. Patient has been self-quarantining at home, today was the 14th day.  Patient developed cough, sore throat, loss of taste/smell and muscle aches today. Cough is non-productive.   URI Presenting symptoms: cough and sore throat   Presenting symptoms: no fever   Severity:  Moderate Duration:  1 day Associated symptoms: headaches and myalgias        Past Medical History:  Diagnosis Date  . Amenorrhea 03/13/2013  . Asthma   . LLQ pain 11/09/2013   She says she feels mass, I did not but she is tender will get [place]  . Patient desires pregnancy 07/14/2013  . PCO (polycystic ovaries) 03/27/2013    Patient Active Problem List   Diagnosis Date Noted  . LLQ pain 11/09/2013  . Patient desires pregnancy 07/14/2013  . PCO (polycystic ovaries) 03/27/2013  . Unspecified symptom associated with female genital organs 03/13/2013  . Amenorrhea 03/13/2013    Past Surgical History:  Procedure Laterality Date  . NO PAST SURGERIES       OB History    Gravida      Para      Term      Preterm      AB      Living  0     SAB      TAB      Ectopic      Multiple      Live Births              Family History  Problem Relation Age of Onset  . Thyroid disease Father   . Cancer Maternal Grandmother        ovarian  . Cancer Maternal Aunt        ovarian    Social History   Tobacco Use  . Smoking status: Former Smoker    Types: Cigarettes  . Smokeless tobacco: Never Used  Substance Use Topics  . Alcohol use: No  . Drug use: No    Home Medications Prior to Admission medications   Medication Sig Start Date End Date Taking? Authorizing Provider  albuterol  (PROVENTIL HFA;VENTOLIN HFA) 108 (90 BASE) MCG/ACT inhaler Inhale into the lungs every 6 (six) hours as needed for wheezing or shortness of breath.   Yes [provider]  guaifenesin (COUGH SYRUP) 100 MG/5ML syrup Take 200 mg by mouth 3 (three) times daily as needed for cough.   Yes [provider]  pantoprazole (PROTONIX) 20 MG tablet Take 1 tablet (20 mg total) by mouth 2 (two) times daily. Patient taking differently: Take 20 mg by mouth daily as needed for heartburn or indigestion.  11/13/18  Yes Loren Racer, MD  ibuprofen (ADVIL) 600 MG tablet Take 1 tablet (600 mg total) by mouth 4 (four) times daily. Patient not taking: Reported on 10/27/2019 08/11/19   Ivery Quale, PA-C    Allergies    Patient has no known allergies.  Review of Systems   Review of Systems  Constitutional: Negative for fever.  HENT: Positive for sore throat.   Respiratory: Positive for cough.   Gastrointestinal: Negative for abdominal pain, nausea and vomiting.  Musculoskeletal: Positive for myalgias.  Neurological:  Positive for headaches.  All other systems reviewed and are negative.   Physical Exam Updated Vital Signs BP 120/81 (BP Location: Right Arm)   Pulse 87   Temp 98.3 F (36.8 C) (Oral)   Resp 18   Ht 5\' 3"  (1.6 m)   Wt 104.3 kg   SpO2 97%   BMI 40.74 kg/m   Physical Exam Vitals and nursing note reviewed.  Constitutional:      Appearance: Normal appearance.  HENT:     Head: Normocephalic.  Eyes:     Conjunctiva/sclera: Conjunctivae normal.  Cardiovascular:     Rate and Rhythm: Normal rate and regular rhythm.  Pulmonary:     Effort: Pulmonary effort is normal.     Breath sounds: Normal breath sounds.  Abdominal:     Palpations: Abdomen is soft.  Musculoskeletal:        General: Normal range of motion.  Skin:    General: Skin is warm and dry.  Neurological:     Mental Status: She is alert and oriented to person, place, and time.  Psychiatric:        Mood  and Affect: Mood normal.     ED Results / Procedures / Treatments   Labs (all labs ordered are listed, but only abnormal results are displayed) Labs Reviewed  POC SARS CORONAVIRUS 2 AG -  ED - Abnormal; Notable for the following components:      Result Value   SARS Coronavirus 2 Ag POSITIVE (*)    All other components within normal limits    EKG None  Radiology No results found.  Procedures Procedures (including critical care time)  Medications Ordered in ED Medications - No data to display  ED Course  I have reviewed the triage vital signs and the nursing notes.  Pertinent labs & imaging results that were available during my care of the patient were reviewed by me and considered in my medical decision making (see chart for details).    MDM Rules/Calculators/A&P                     RHIANA MORASH was evaluated in Emergency Department on 10/27/2019 for the symptoms described in the history of present illness. She was evaluated in the context of the global COVID-19 pandemic, which necessitated consideration that the patient might be at risk for infection with the SARS-CoV-2 virus that causes COVID-19. Institutional protocols and algorithms that pertain to the evaluation of patients at risk for COVID-19 are in a state of rapid change based on information released by regulatory bodies including the CDC and federal and state organizations. These policies and algorithms were followed during the patient's care in the ED.  Patient evaluated, tested and sent home with instructions for home care and Quarantine. Instructed to seek further care if symptoms worsen.    Final Clinical Impression(s) / ED Diagnoses Final diagnoses:  [id number]    Rx / DC Orders ED Discharge Orders         Ordered    dextromethorphan-guaiFENesin [facility] DM) 30-600 MG 12hr tablet  2 times daily     10/27/19 2254           Etta Quill, NP 10/28/19 0041    Fredia Sorrow, MD 11/03/19 [phone]

## 2019-10-27 NOTE — ED Notes (Signed)
Patient signed discharged dispo but signature pad did not capture signature. Patient given discharge instructions and instruction on quarantine.

## 2019-10-27 NOTE — Discharge Instructions (Signed)
Please follow-up with your primary care provider. Ibuprofen or tylenol for body aches/fever. Mucinex DM for cough.

## 2019-10-27 NOTE — ED Triage Notes (Signed)
Pt states she has been exposed to covid and has cough sore throat, no taste or smell and body aches

## 2019-10-28 ENCOUNTER — Other Ambulatory Visit: Payer: Self-pay

## 2019-10-28 ENCOUNTER — Encounter (HOSPITAL_COMMUNITY): Payer: Self-pay | Admitting: *Deleted

## 2019-10-28 ENCOUNTER — Telehealth (HOSPITAL_COMMUNITY): Payer: Self-pay | Admitting: Nurse Practitioner

## 2019-10-28 ENCOUNTER — Emergency Department (HOSPITAL_COMMUNITY)
Admission: EM | Admit: 2019-10-28 | Discharge: 2019-10-28 | Disposition: A | Discharge status: Home / Self Care | Payer: 59 | Attending: Emergency Medicine | Admitting: Emergency Medicine

## 2019-10-28 DIAGNOSIS — U071 COVID-19: Secondary | ICD-10-CM | POA: Insufficient documentation

## 2019-10-28 DIAGNOSIS — M7918 Myalgia, other site: Secondary | ICD-10-CM | POA: Insufficient documentation

## 2019-10-28 DIAGNOSIS — R0602 Shortness of breath: Secondary | ICD-10-CM | POA: Insufficient documentation

## 2019-10-28 DIAGNOSIS — Z5321 Procedure and treatment not carried out due to patient leaving prior to being seen by health care provider: Secondary | ICD-10-CM | POA: Insufficient documentation

## 2019-10-28 NOTE — Telephone Encounter (Signed)
Called patient to screen for MAB therapy. Patient experiencing difficulty breathing which she says started this morning and has worsened throughout the day. Referred patient to ER.

## 2019-10-28 NOTE — ED Triage Notes (Signed)
Pt was seen here last night and was diagnosed with Covid; pt states she still feels sob; pt states someone from [facility] called her today and asked how she was and she told the person she was sob; the member told pt to come in to be seen

## 2020-01-10 ENCOUNTER — Encounter (HOSPITAL_COMMUNITY): Payer: Self-pay | Admitting: Emergency Medicine

## 2020-01-10 ENCOUNTER — Emergency Department (HOSPITAL_COMMUNITY)
Admission: EM | Admit: 2020-01-10 | Discharge: 2020-01-10 | Disposition: A | Discharge status: Home / Self Care | Payer: 59 | Attending: Emergency Medicine | Admitting: Emergency Medicine

## 2020-01-10 ENCOUNTER — Other Ambulatory Visit: Payer: Self-pay

## 2020-01-10 ENCOUNTER — Emergency Department (HOSPITAL_COMMUNITY): Payer: 59

## 2020-01-10 DIAGNOSIS — Y999 Unspecified external cause status: Secondary | ICD-10-CM | POA: Insufficient documentation

## 2020-01-10 DIAGNOSIS — Z79899 Other long term (current) drug therapy: Secondary | ICD-10-CM | POA: Insufficient documentation

## 2020-01-10 DIAGNOSIS — W1789XA Other fall from one level to another, initial encounter: Secondary | ICD-10-CM | POA: Diagnosis not present

## 2020-01-10 DIAGNOSIS — W19XXXA Unspecified fall, initial encounter: Secondary | ICD-10-CM

## 2020-01-10 DIAGNOSIS — Z87891 Personal history of nicotine dependence: Secondary | ICD-10-CM | POA: Diagnosis not present

## 2020-01-10 DIAGNOSIS — Y929 Unspecified place or not applicable: Secondary | ICD-10-CM | POA: Insufficient documentation

## 2020-01-10 DIAGNOSIS — Y939 Activity, unspecified: Secondary | ICD-10-CM | POA: Diagnosis not present

## 2020-01-10 DIAGNOSIS — S0990XA Unspecified injury of head, initial encounter: Secondary | ICD-10-CM | POA: Diagnosis not present

## 2020-01-10 MED ORDER — ACETAMINOPHEN 500 MG PO TABS
1000.0000 mg | ORAL_TABLET | Freq: Once | ORAL | Status: AC
  Administered 2020-01-10: 1000 mg via ORAL
  Filled 2020-01-10: qty 2

## 2020-01-10 NOTE — Discharge Instructions (Addendum)
HEAD INJURY If any of the following occur notify your physician or go to the Hospital Emergency Department:  Increased drowsiness, stupor or loss of consciousness  Restlessness or convulsions (fits)  Paralysis in arms or legs  Temperature above 100 F  Vomiting  Severe headache  Blood or clear fluid dripping from the nose or ears  Stiffness of the neck  Dizziness or blurred vision  Pulsating pain in the eye  Unequal pupils of eye  Personality changes  Any other unusual symptoms PRECAUTIONS (Elevate mattress if pillow is ineffective)  Do not take tranquilizers, sedatives, narcotics or alcohol  Avoid aspirin. Use only acetaminophen (e.g. Tylenol) or ibuprofen (e.g. Advil) for relief of pain. Follow directions on the bottle for dosage.  Use ice packs for comfort  Getting plenty of rest and sleep helps the brain to heal. Do not try to do too much too fast. As you start to feel better, you can slowly and gradually return to your usual routine.  Avoid activities that are physically demanding (e.g., sports, heavy housecleaning, exercising) or require a lot of thinking or concentration (e.g., working on the computer, playing video games).  Ask your health care professional when you can safely drive a car, ride a bike, or operate heavy equipment. MEDICATIONS Use medications only as directed by your physician  Follow up with your primary care provider or with the concussion clinic within 5-7 days for re-evaluation.

## 2020-01-10 NOTE — ED Provider Notes (Signed)
[facility] EMERGENCY DEPARTMENT Provider Note   CSN: [id number] Arrival date & time: 01/10/20  1117     History Chief Complaint  Patient presents with  . Fall    Krystal Porter is a 29 y.o. female.  HPI   Pt is a 29 y/o female with a h/o amenorrhea, asthma, LLQ abd pain, PCOS who presents to the ED today for eval of a fall. States she was standing on her porch and fell backwards down about 1 step. States that she hit the back of her head. She denies LOC. She reports a headache rated 9/10. Denies associated vomiting  She reports associated dizziness, lightheadedness, and intermittent blurred vision. States she is feeling somewhat off balance and feels like her speech was slurred today. Denies unilateral numbness/weakness. Denies any other injuries. Denies blood thinner use.   Past Medical History:  Diagnosis Date  . Amenorrhea 03/13/2013  . Asthma   . LLQ pain 11/09/2013   She says she feels mass, I did not but she is tender will get [place]  . Patient desires pregnancy 07/14/2013  . PCO (polycystic ovaries) 03/27/2013    Patient Active Problem List   Diagnosis Date Noted  . LLQ pain 11/09/2013  . Patient desires pregnancy 07/14/2013  . PCO (polycystic ovaries) 03/27/2013  . Unspecified symptom associated with female genital organs 03/13/2013  . Amenorrhea 03/13/2013    Past Surgical History:  Procedure Laterality Date  . NO PAST SURGERIES       OB History    Gravida      Para      Term      Preterm      AB      Living  0     SAB      TAB      Ectopic      Multiple      Live Births              Family History  Problem Relation Age of Onset  . Thyroid disease Father   . Cancer Maternal Grandmother        ovarian  . Cancer Maternal Aunt        ovarian    Social History   Tobacco Use  . Smoking status: Former Smoker    Types: Cigarettes  . Smokeless tobacco: Never Used  Substance Use Topics  . Alcohol use: No  . Drug use: No    Home  Medications Prior to Admission medications   Medication Sig Start Date End Date Taking? Authorizing Provider  albuterol (PROVENTIL HFA;VENTOLIN HFA) 108 (90 BASE) MCG/ACT inhaler Inhale into the lungs every 6 (six) hours as needed for wheezing or shortness of breath.   Yes [provider]  dextromethorphan-guaiFENesin (MUCINEX DM) 30-600 MG 12hr tablet Take 1 tablet by mouth 2 (two) times daily. Patient not taking: Reported on 01/10/2020 10/27/19   Felicie Morn, NP  ibuprofen (ADVIL) 600 MG tablet Take 1 tablet (600 mg total) by mouth 4 (four) times daily. Patient not taking: Reported on 10/27/2019 08/11/19   Ivery Quale, PA-C  pantoprazole (PROTONIX) 20 MG tablet Take 1 tablet (20 mg total) by mouth 2 (two) times daily. Patient not taking: Reported on 01/10/2020 11/13/18   Loren Racer, MD    Allergies    Patient has no known allergies.  Review of Systems   Review of Systems  Constitutional: Negative for fever.  HENT: Negative for dental problem.   Eyes: Positive for visual disturbance.  Respiratory: Negative for shortness of breath.   Cardiovascular: Negative for chest pain.  Gastrointestinal: Negative for abdominal pain.  Genitourinary: Negative for pelvic pain.  Musculoskeletal: Negative for back pain and neck pain.  Skin: Negative for wound.  Neurological: Positive for dizziness, speech difficulty, light-headedness and headaches. Negative for weakness and numbness.       Head injury, no loc    Physical Exam Updated Vital Signs BP 124/89 (BP Location: Right Arm)   Pulse 81   Temp 98.2 F (36.8 C) (Oral)   Resp 16   Ht 5\' 3"  (1.6 m)   Wt 104.3 kg   LMP 01/10/2020   SpO2 97%   BMI 40.74 kg/m   Physical Exam Vitals and nursing note reviewed.  Constitutional:      General: She is not in acute distress.    Appearance: She is well-developed.  HENT:     Head: Normocephalic and atraumatic.     Comments: No battle signs, no raccoons eyes, no rhinorrhea, no  hemotympanum.  Eyes:     Extraocular Movements: Extraocular movements intact.     Conjunctiva/sclera: Conjunctivae normal.     Pupils: Pupils are equal, round, and reactive to light.  Cardiovascular:     Rate and Rhythm: Normal rate.  Pulmonary:     Effort: Pulmonary effort is normal.  Musculoskeletal:        General: Normal range of motion.     Cervical back: Neck supple.     Comments: Mild TTP to the upper cervical spine and to the left cervical paraspinous muscles  Skin:    General: Skin is warm and dry.  Neurological:     Mental Status: She is alert.     Comments: Mental Status:  Alert, thought content appropriate, able to give a coherent history. Speech fluent without evidence of aphasia. Able to follow 2 step commands without difficulty.  Cranial Nerves:  II:  pupils equal, round, reactive to light III,IV, VI: ptosis not present, extra-ocular motions intact bilaterally  V,VII: smile symmetric, facial light touch sensation equal VIII: hearing grossly normal to voice  X: uvula elevates symmetrically  XI: bilateral shoulder shrug symmetric and strong XII: midline tongue extension without fassiculations Motor:  Normal tone. 5/5 strength of BUE and BLE major muscle groups including strong and equal grip strength and dorsiflexion/plantar flexion Sensory: light touch normal in all extremities. Cerebellar: normal finger-to-nose with bilateral upper extremities, normal heel to shin bilaterally Gait: normal gait and balance.       ED Results / Procedures / Treatments   Labs (all labs ordered are listed, but only abnormal results are displayed) Labs Reviewed - No data to display  EKG None  Radiology CT Head Wo Contrast  Result Date: 01/10/2020 CLINICAL DATA:  Headache post-traumatic. Fall. EXAM: CT HEAD WITHOUT CONTRAST CT CERVICAL SPINE WITHOUT CONTRAST TECHNIQUE: Multidetector CT imaging of the head and cervical spine was performed following the standard protocol without  intravenous contrast. Multiplanar CT image reconstructions of the cervical spine were also generated. COMPARISON:  None FINDINGS: CT HEAD FINDINGS Brain: No evidence of acute infarction, hemorrhage, hydrocephalus, extra-axial collection or mass lesion/mass effect. Vascular: No hyperdense vessel or unexpected calcification. Skull: Normal. Negative for fracture or focal lesion. Sinuses/Orbits: Visualized paranasal sinuses and orbits are unremarkable. Other: None CT CERVICAL SPINE FINDINGS Alignment: Unchanged reversal of normal cervical lordotic curvature when compared to 03/15/2018. Skull base and vertebrae: No acute fracture. No primary bone lesion or focal pathologic process. Soft tissues and spinal canal: No  prevertebral fluid or swelling. No visible canal hematoma. Disc levels: No significant degenerative change, disc spaces are maintained. Upper chest: Not assessed. Other: None IMPRESSION: 1. No acute intracranial abnormality. 2. No evidence of acute fracture or subluxation of the cervical spine. 3. Unchanged reversal of normal cervical lordotic curvature when compared to 03/15/2018, potentially related to patient positioning or spasm. Electronically Signed   By: Donzetta Kohut M.D.   On: 01/10/2020 14:23   CT Cervical Spine Wo Contrast  Result Date: 01/10/2020 CLINICAL DATA:  Headache post-traumatic. Fall. EXAM: CT HEAD WITHOUT CONTRAST CT CERVICAL SPINE WITHOUT CONTRAST TECHNIQUE: Multidetector CT imaging of the head and cervical spine was performed following the standard protocol without intravenous contrast. Multiplanar CT image reconstructions of the cervical spine were also generated. COMPARISON:  None FINDINGS: CT HEAD FINDINGS Brain: No evidence of acute infarction, hemorrhage, hydrocephalus, extra-axial collection or mass lesion/mass effect. Vascular: No hyperdense vessel or unexpected calcification. Skull: Normal. Negative for fracture or focal lesion. Sinuses/Orbits: Visualized paranasal sinuses  and orbits are unremarkable. Other: None CT CERVICAL SPINE FINDINGS Alignment: Unchanged reversal of normal cervical lordotic curvature when compared to 03/15/2018. Skull base and vertebrae: No acute fracture. No primary bone lesion or focal pathologic process. Soft tissues and spinal canal: No prevertebral fluid or swelling. No visible canal hematoma. Disc levels: No significant degenerative change, disc spaces are maintained. Upper chest: Not assessed. Other: None IMPRESSION: 1. No acute intracranial abnormality. 2. No evidence of acute fracture or subluxation of the cervical spine. 3. Unchanged reversal of normal cervical lordotic curvature when compared to 03/15/2018, potentially related to patient positioning or spasm. Electronically Signed   By: Donzetta Kohut M.D.   On: 01/10/2020 14:23    Procedures Procedures (including critical care time)  Medications Ordered in ED Medications  acetaminophen (TYLENOL) tablet 1,000 mg (1,000 mg Oral Given 01/10/20 1304)    ED Course  I have reviewed the triage vital signs and the nursing notes.  Pertinent labs & imaging results that were available during my care of the patient were reviewed by me and considered in my medical decision making (see chart for details).    MDM Rules/Calculators/A&P                      29 year old female presenting for evaluation after she had a mechanical fall yesterday and struck her head.  She did not lose consciousness but she is having a headache with some lightheadedness dizziness and intermittent vision changes.  Neuro exam is normal in the ED today.  Will obtain CT of the head.  She also has some cervical spine tenderness we will also get CT cervical spine.  She denies any other injuries.  All imaging personally reviewed/interpreted CT head CT cervical spine  Patient provided with Tylenol in the ED and on reassessment she feels somewhat improved.  Discussed findings of imaging.  Discussed plan for follow-up with  either her PCP or the concussion clinic. Advised on head injury precautions.  Discussed on specific return precautions.  She voiced understanding plan reasons to return. all Questions answered. patient stable for discharge.  Final Clinical Impression(s) / ED Diagnoses Final diagnoses:  Fall, initial encounter  Injury of head, initial encounter    Rx / DC Orders ED Discharge Orders    None       Karrie Meres, PA-C 01/10/20 1454    Vanetta Mulders, MD 01/13/20 1718

## 2020-01-10 NOTE — ED Triage Notes (Signed)
Misstep  Fell  Hit head on washer No LOC   Now pain to occipital region  Pt in wheel chair and reports ataxia

## 2020-11-18 IMAGING — CT CT HEAD W/O CM
3 series · 14 of 47 positions shown, 16 images · non-contrast
Comparison: None

CLINICAL DATA: Headache post-traumatic. Fall.

EXAM:
CT HEAD WITHOUT CONTRAST
CT CERVICAL SPINE WITHOUT CONTRAST
TECHNIQUE: Multidetector CT imaging of the head and cervical spine was
performed following the standard protocol without intravenous
contrast. Multiplanar CT image reconstructions of the cervical spine
were also generated.

[Series 2: head w o · axial · 0.42mm/px · z∈[+221,+346]mm · 8 of 31 slices shown, 10 images]
[im 3/31  brain]
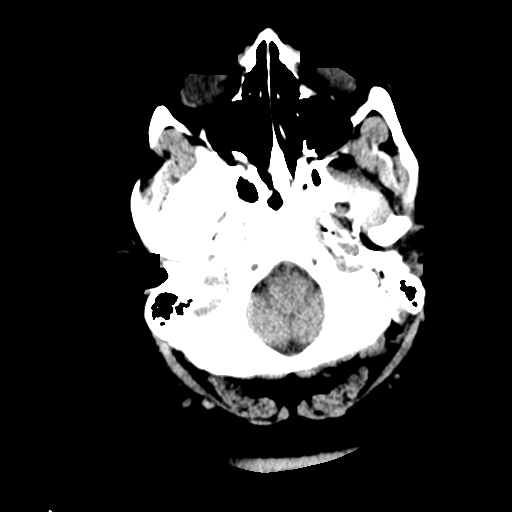
[im 3/31  bone]
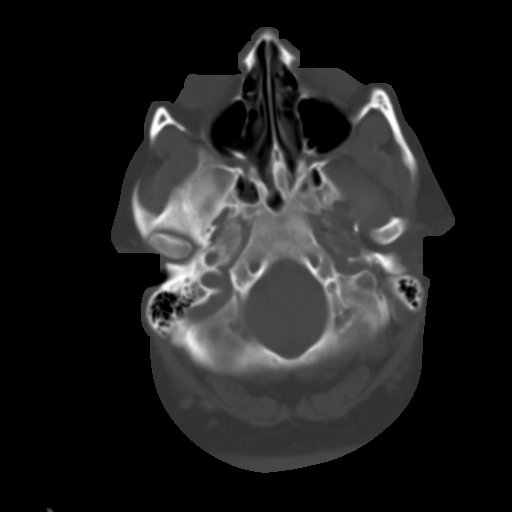
[im 7/31  brain]
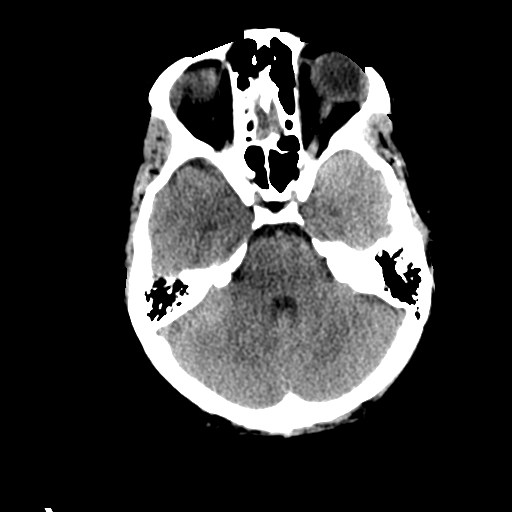
[im 10/31  brain]
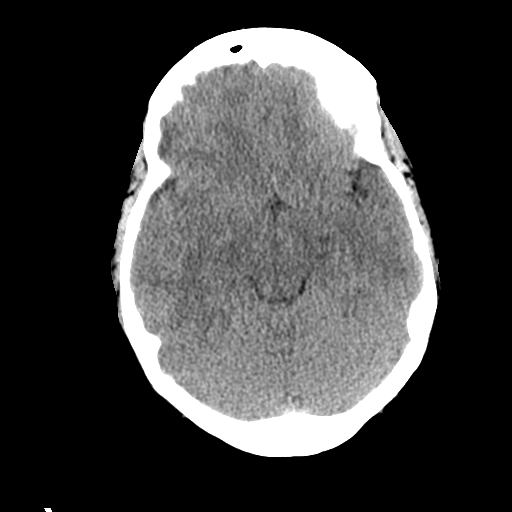
[im 14/31  brain]
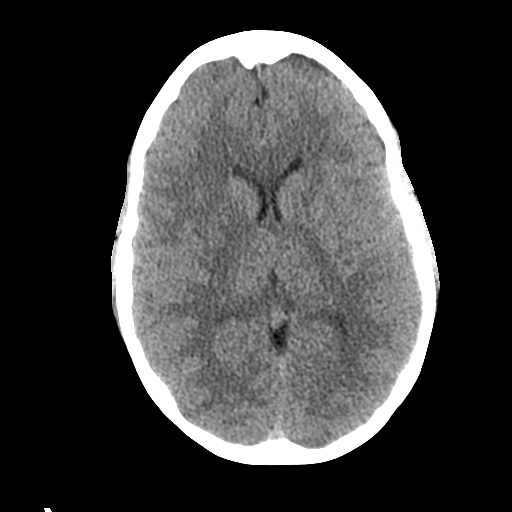
[im 17/31  brain]
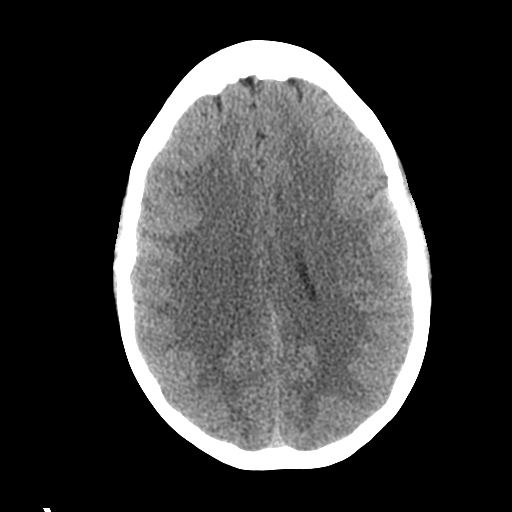
[im 17/31  bone]
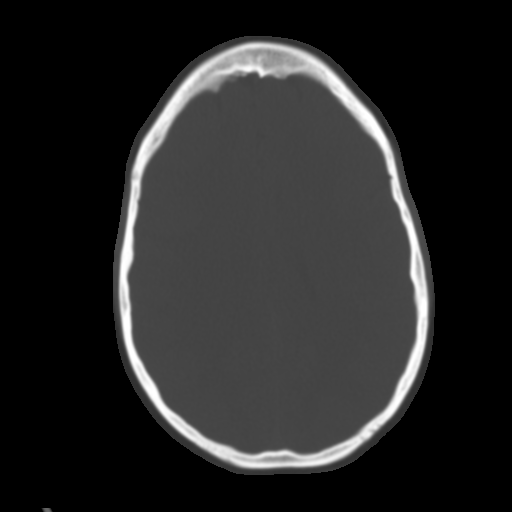
[im 21/31  brain]
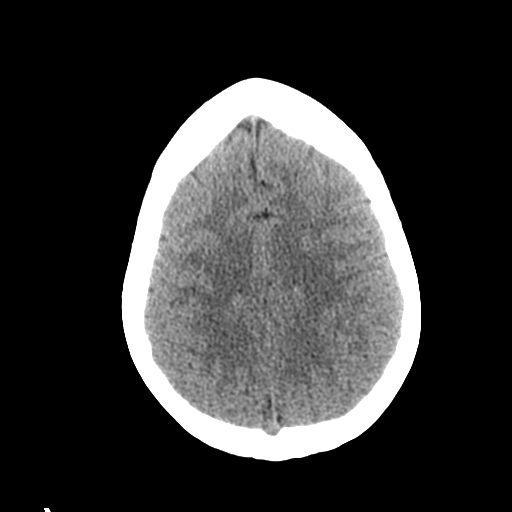
[im 24/31  brain]
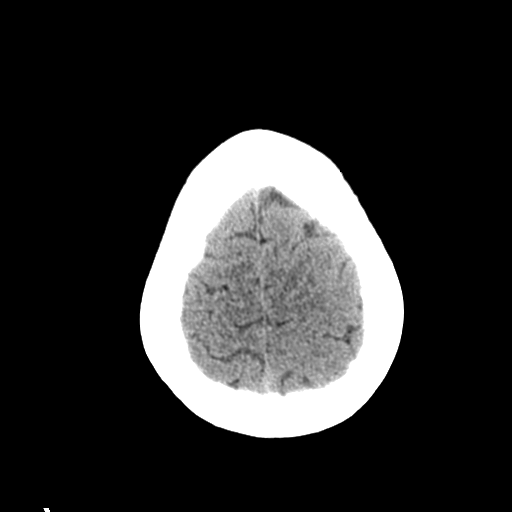
[im 28/31  brain]
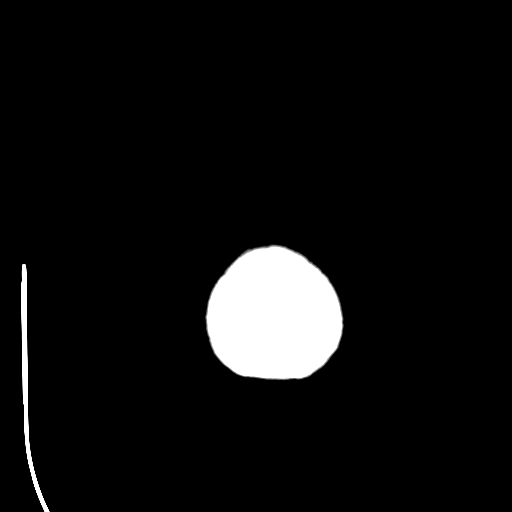

[Series 4: coronal soft · coronal · 0.30mm/px · 3 of 76 slices shown]
[im 26/76  brain]
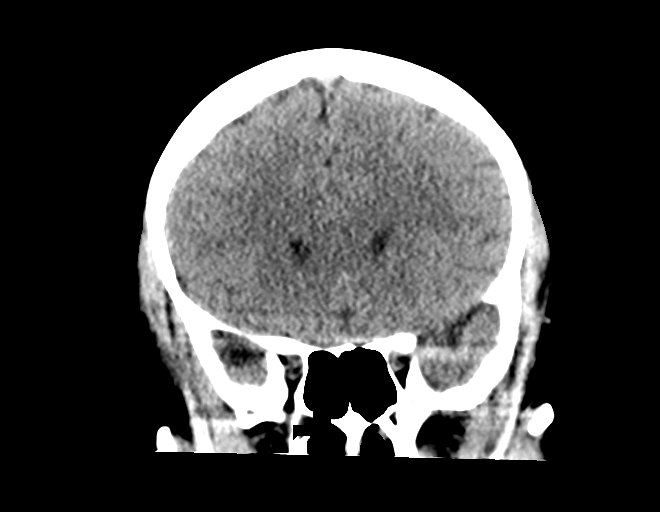
[im 34/76  brain]
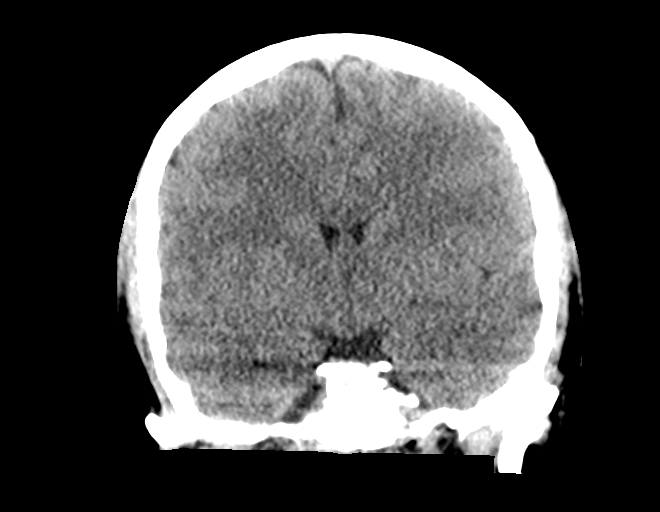
[im 42/76  brain]
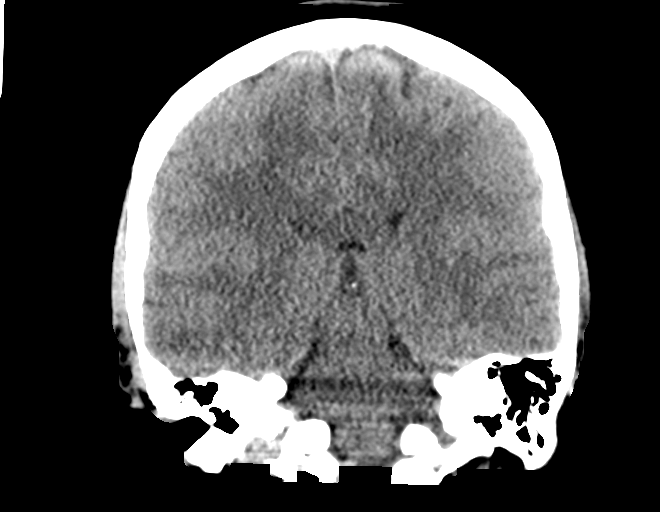

[Series 5: sagittal soft · sagittal · 0.30mm/px · 3 of 63 slices shown]
[im 21/63  brain]
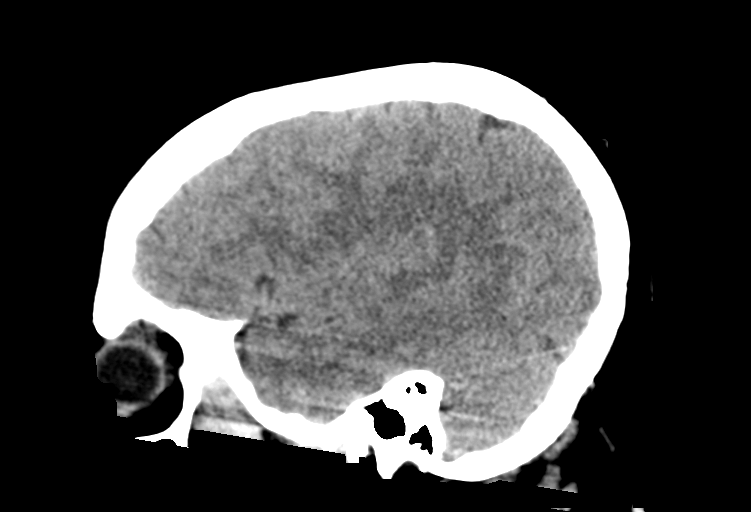
[im 32/63  brain]
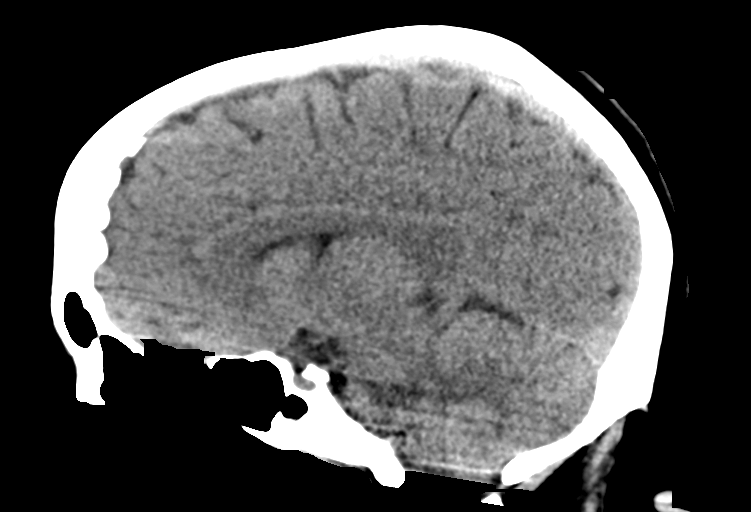
[im 42/63  brain]
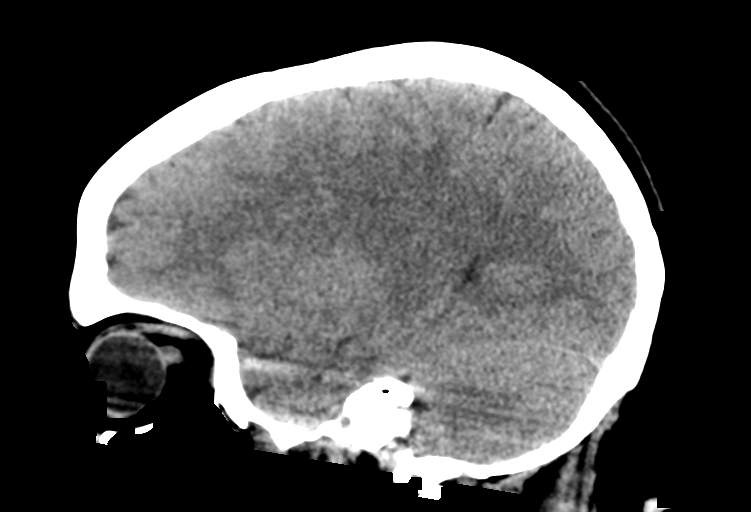

[14 of 47 positions shown; findings below may reference images not displayed]

FINDINGS: CT HEAD FINDINGS

Brain: No evidence of acute infarction, hemorrhage, hydrocephalus,
extra-axial collection or mass lesion/mass effect.

Vascular: No hyperdense vessel or unexpected calcification.

Skull: Normal. Negative for fracture or focal lesion.

Sinuses/Orbits: Visualized paranasal sinuses and orbits are
unremarkable.

Other: None

CT CERVICAL SPINE FINDINGS

Alignment: Unchanged reversal of normal cervical lordotic curvature
when compared to 03/15/2018.

Skull base and vertebrae: No acute fracture. No primary bone lesion
or focal pathologic process.

Soft tissues and spinal canal: No prevertebral fluid or swelling. No
visible canal hematoma.

Disc levels: No significant degenerative change, disc spaces are
maintained.

Upper chest: Not assessed.

Other: None
IMPRESSION: 1. No acute intracranial abnormality.
2. No evidence of acute fracture or subluxation of the cervical
spine.
3. Unchanged reversal of normal cervical lordotic curvature when
compared to 03/15/2018, potentially related to patient positioning
or spasm.

## 2020-11-18 IMAGING — CT CT CERVICAL SPINE W/O CM
3 of 4 series · 13 of 33 positions shown, 16 images · non-contrast
Comparison: None

CLINICAL DATA: Headache post-traumatic. Fall.

EXAM:
CT HEAD WITHOUT CONTRAST
CT CERVICAL SPINE WITHOUT CONTRAST
TECHNIQUE: Multidetector CT imaging of the head and cervical spine was
performed following the standard protocol without intravenous
contrast. Multiplanar CT image reconstructions of the cervical spine
were also generated.

[Series 5: sagittal bone · sagittal · 0.23mm/px · 5 of 61 slices shown, 6 images]
[im 21/61  bone]
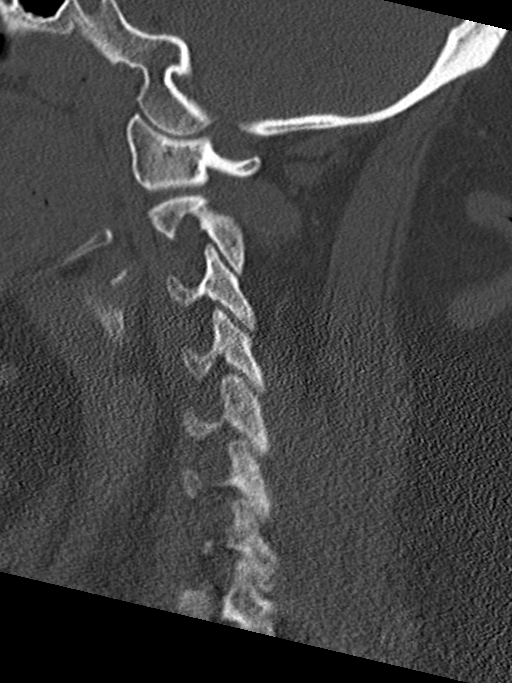
[im 26/61  bone]
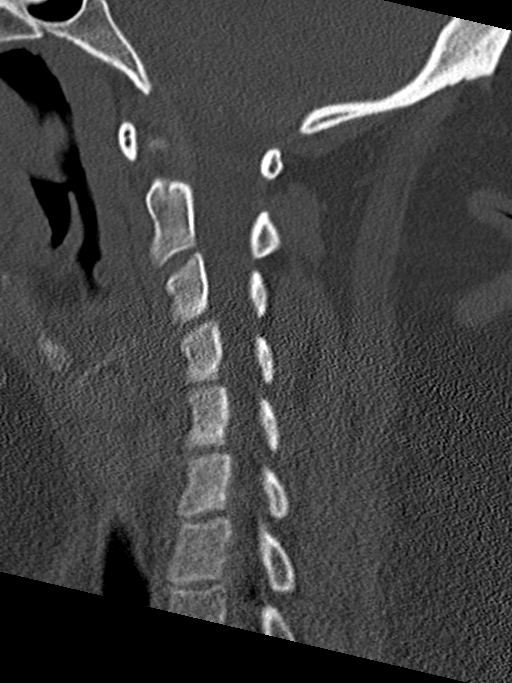
[im 31/61  soft-tissue]
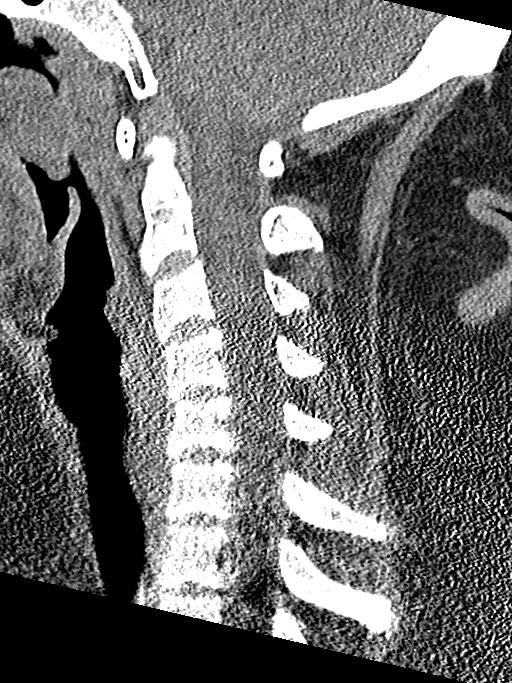
[im 31/61  bone]
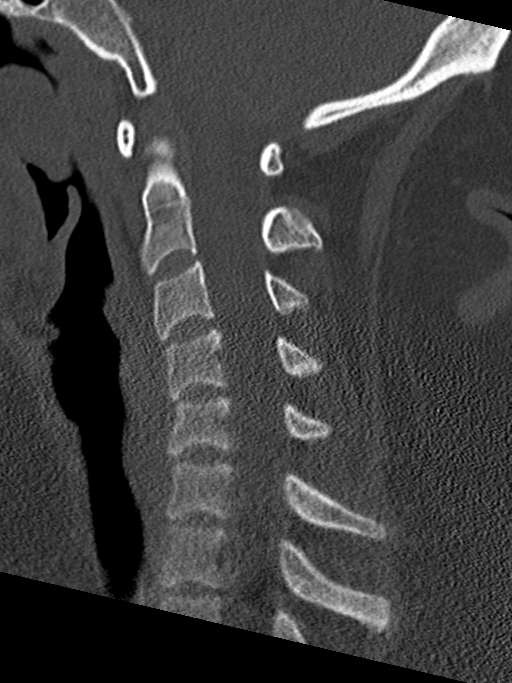
[im 36/61  bone]
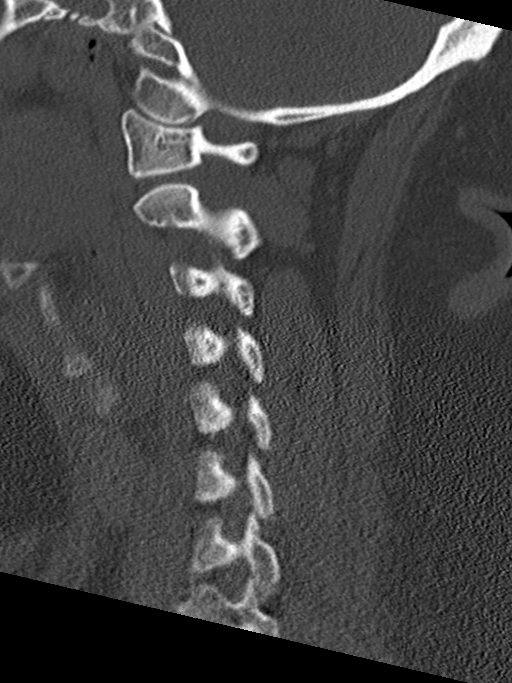
[im 41/61  bone]
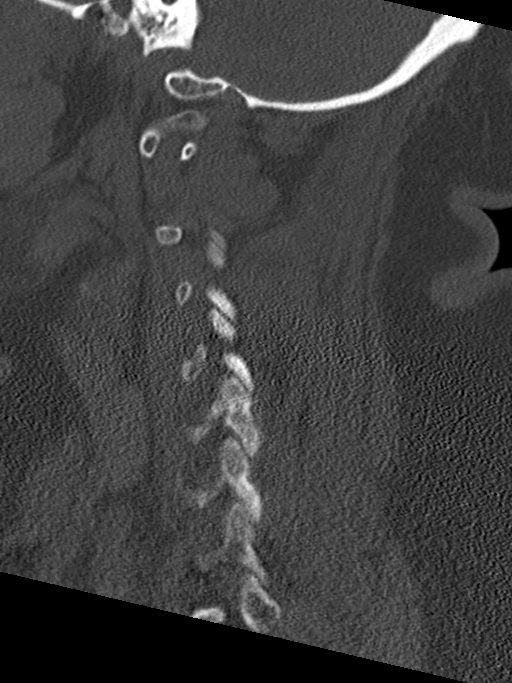

[Series 6: coronal bone · coronal · 0.23mm/px · 3 of 58 slices shown]
[im 12/58  bone]
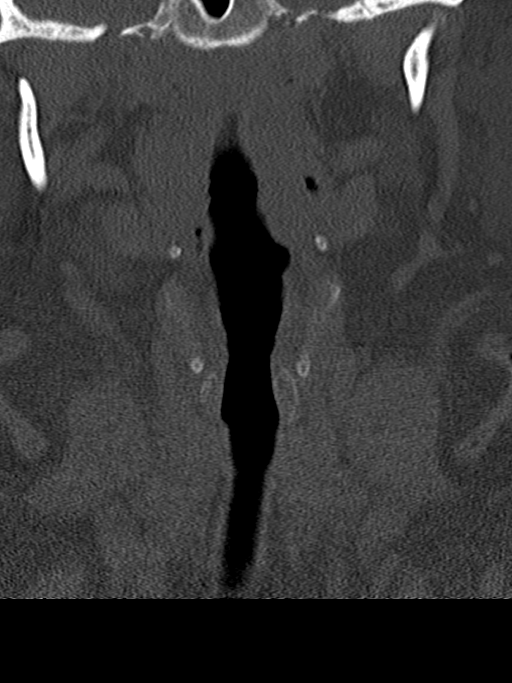
[im 23/58  bone]
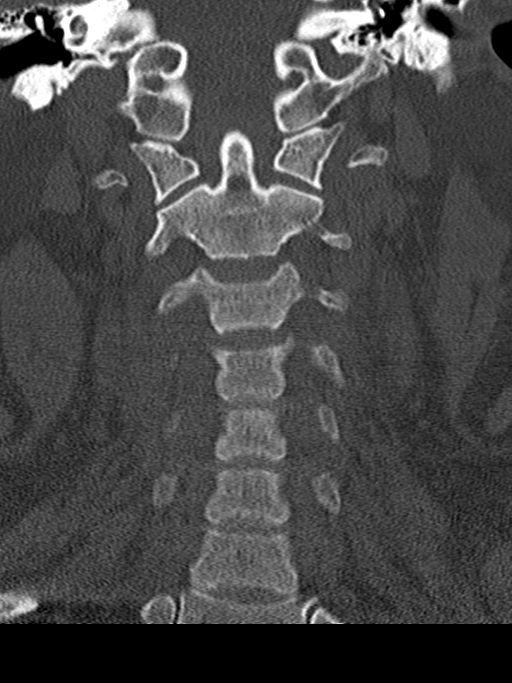
[im 35/58  bone]
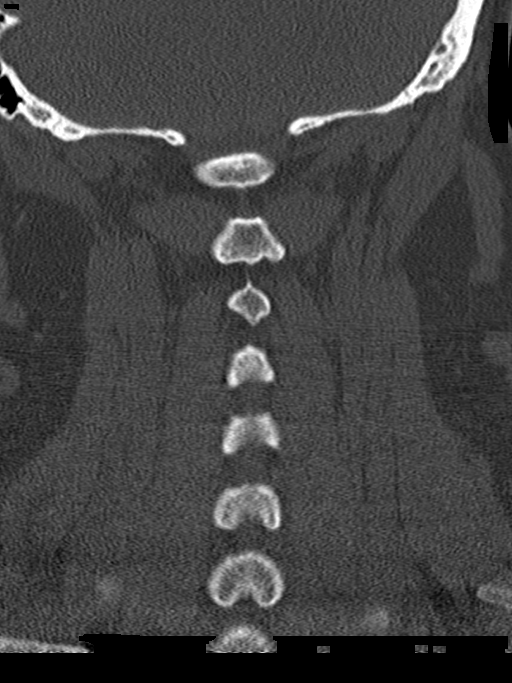

[Series 7: orthogonal axials · axial · 0.21mm/px · z∈[+87,+166]mm · 5 of 71 slices shown, 7 images]
[im 12/71  soft-tissue]
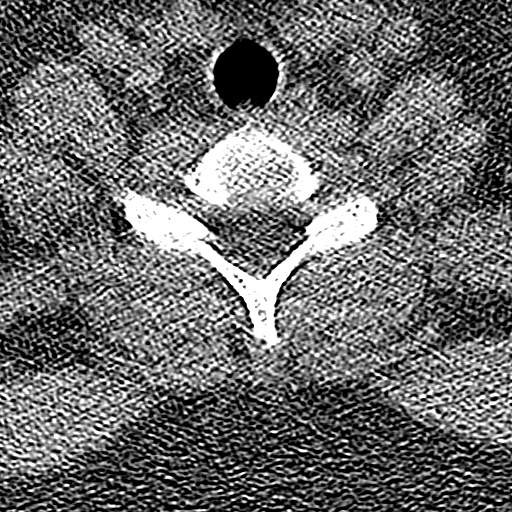
[im 12/71  bone]
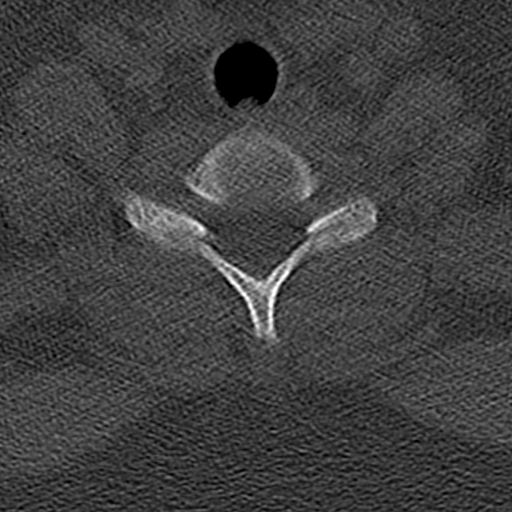
[im 24/71  bone]
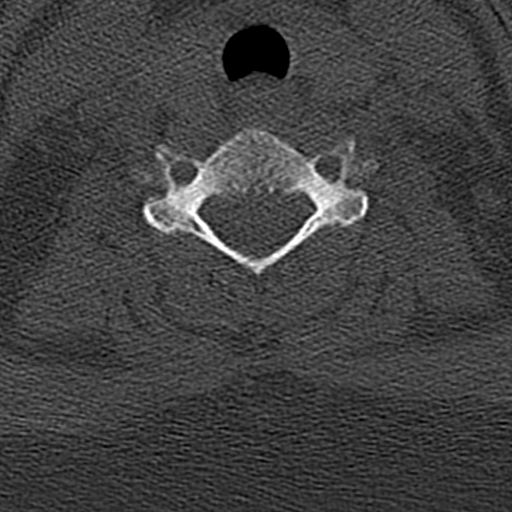
[im 36/71  bone]
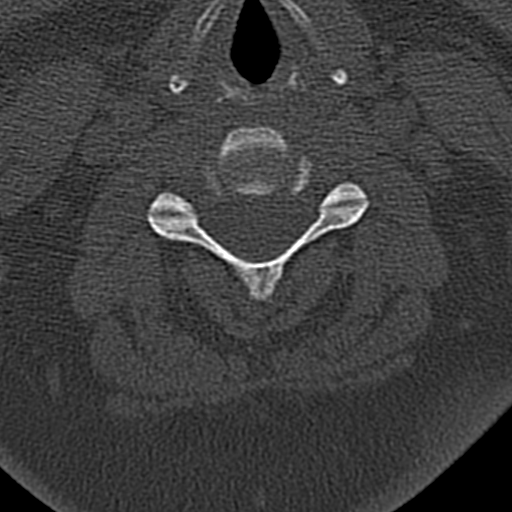
[im 47/71  bone]
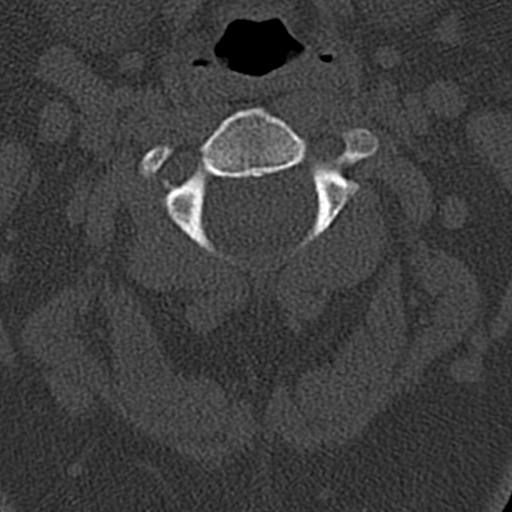
[im 59/71  soft-tissue]
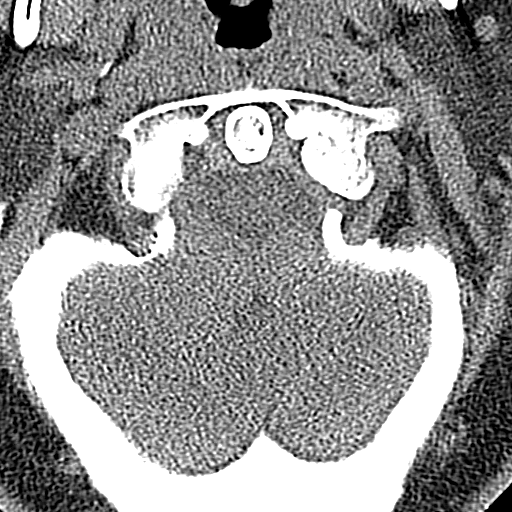
[im 59/71  bone]
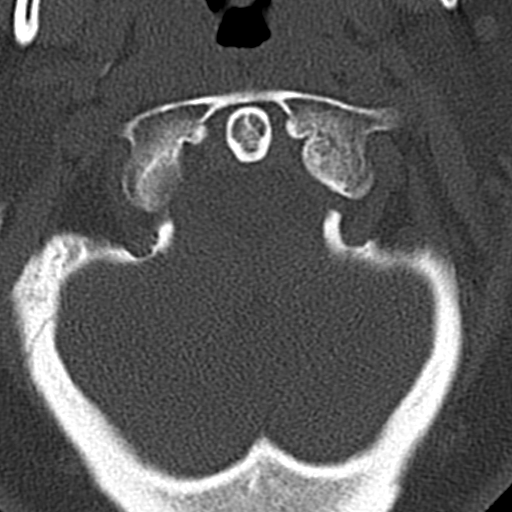

[13 of 33 positions shown; findings below may reference images not displayed]

FINDINGS: CT HEAD FINDINGS

Brain: No evidence of acute infarction, hemorrhage, hydrocephalus,
extra-axial collection or mass lesion/mass effect.

Vascular: No hyperdense vessel or unexpected calcification.

Skull: Normal. Negative for fracture or focal lesion.

Sinuses/Orbits: Visualized paranasal sinuses and orbits are
unremarkable.

Other: None

CT CERVICAL SPINE FINDINGS

Alignment: Unchanged reversal of normal cervical lordotic curvature
when compared to 03/15/2018.

Skull base and vertebrae: No acute fracture. No primary bone lesion
or focal pathologic process.

Soft tissues and spinal canal: No prevertebral fluid or swelling. No
visible canal hematoma.

Disc levels: No significant degenerative change, disc spaces are
maintained.

Upper chest: Not assessed.

Other: None
IMPRESSION: 1. No acute intracranial abnormality.
2. No evidence of acute fracture or subluxation of the cervical
spine.
3. Unchanged reversal of normal cervical lordotic curvature when
compared to 03/15/2018, potentially related to patient positioning
or spasm.

## 2022-04-25 ENCOUNTER — Encounter (HOSPITAL_COMMUNITY): Payer: Self-pay

## 2022-04-25 ENCOUNTER — Emergency Department (HOSPITAL_COMMUNITY)
Admission: EM | Admit: 2022-04-25 | Discharge: 2022-04-25 | Disposition: A | Discharge status: Home / Self Care | Payer: 59 | Attending: Emergency Medicine | Admitting: Emergency Medicine

## 2022-04-25 ENCOUNTER — Other Ambulatory Visit: Payer: Self-pay

## 2022-04-25 DIAGNOSIS — R519 Headache, unspecified: Secondary | ICD-10-CM | POA: Diagnosis present

## 2022-04-25 LAB — PREGNANCY, URINE: Preg Test, Ur: NEGATIVE

## 2022-04-25 MED ORDER — METOCLOPRAMIDE HCL 5 MG/ML IJ SOLN
10.0000 mg | Freq: Once | INTRAMUSCULAR | Status: AC
  Administered 2022-04-25: 10 mg via INTRAVENOUS
  Filled 2022-04-25: qty 2

## 2022-04-25 MED ORDER — DIPHENHYDRAMINE HCL 25 MG PO CAPS
25.0000 mg | ORAL_CAPSULE | Freq: Once | ORAL | Status: AC
  Administered 2022-04-25: 25 mg via ORAL
  Filled 2022-04-25: qty 1

## 2022-04-25 MED ORDER — CYCLOBENZAPRINE HCL 10 MG PO TABS
10.0000 mg | ORAL_TABLET | Freq: Once | ORAL | Status: AC
  Administered 2022-04-25: 10 mg via ORAL
  Filled 2022-04-25: qty 1

## 2022-04-25 MED ORDER — SODIUM CHLORIDE 0.9 % IV BOLUS
500.0000 mL | Freq: Once | INTRAVENOUS | Status: AC
  Administered 2022-04-25: 500 mL via INTRAVENOUS

## 2022-04-25 MED ORDER — IBUPROFEN 400 MG PO TABS
600.0000 mg | ORAL_TABLET | Freq: Once | ORAL | Status: AC
  Administered 2022-04-25: 600 mg via ORAL
  Filled 2022-04-25: qty 2

## 2022-04-25 NOTE — ED Triage Notes (Signed)
Pt presents to ED with complaints of right sided head pain feels like burning x couple of days. Pt states she is also having trouble remembering things.

## 2022-04-25 NOTE — ED Provider Notes (Signed)
[facility] EMERGENCY DEPARTMENT Provider Note   CSN: [id number] Arrival date & time: 04/25/22  7989     History  Chief Complaint  Patient presents with   Headache    [name] is a 31 y.o. female.  HPI  Without significant medical history presents with complaints of a headache.  Patient has headache started a couple days ago, states has been constant, has gradually gotten worse, remains on the right side of her head, describes as a burning/painful sensation, feels like a pulling sensation in her neck, no change in vision no paresthesias or weakness of upper or lower extremities.  No photophobia associated nausea without vomiting, no recent head trauma, not anticoag's.  Denies any systemic infection.  Patient tried over-the-counter pain medication which relief.  No history of migraines.  Home Medications Prior to Admission medications   Medication Sig Start Date End Date Taking? Authorizing Provider  albuterol (PROVENTIL HFA;VENTOLIN HFA) 108 (90 BASE) MCG/ACT inhaler Inhale into the lungs every 6 (six) hours as needed for wheezing or shortness of breath. Patient not taking: Reported on 04/25/2022    [provider]  dextromethorphan-guaiFENesin (MUCINEX DM) 30-600 MG 12hr tablet Take 1 tablet by mouth 2 (two) times daily. Patient not taking: Reported on 01/10/2020 10/27/19   Felicie Morn, NP  ibuprofen (ADVIL) 600 MG tablet Take 1 tablet (600 mg total) by mouth 4 (four) times daily. Patient not taking: Reported on 10/27/2019 08/11/19   Ivery Quale, PA-C  pantoprazole (PROTONIX) 20 MG tablet Take 1 tablet (20 mg total) by mouth 2 (two) times daily. Patient not taking: Reported on 01/10/2020 11/13/18   Loren Racer, MD      Allergies    Patient has no known allergies.    Review of Systems   Review of Systems  Constitutional:  Negative for chills and fever.  Respiratory:  Negative for shortness of breath.   Cardiovascular:  Negative for chest pain.   Gastrointestinal:  Positive for nausea. Negative for abdominal pain.  Neurological:  Positive for headaches.    Physical Exam Updated Vital Signs BP 117/62   Pulse 74   Temp 97.7 F (36.5 C) (Oral)   Resp 18   Ht 5\' 3"  (1.6 m)   Wt 105.2 kg   LMP 03/25/2022   SpO2 94%   BMI 41.10 kg/m  Physical Exam Vitals and nursing note reviewed.  Constitutional:      General: She is not in acute distress.    Appearance: She is not ill-appearing.  HENT:     Head: Normocephalic and atraumatic.     Nose: No congestion.  Eyes:     Extraocular Movements: Extraocular movements intact.     Conjunctiva/sclera: Conjunctivae normal.     Pupils: Pupils are equal, round, and reactive to light.  Cardiovascular:     Rate and Rhythm: Normal rate and regular rhythm.     Pulses: Normal pulses.  Pulmonary:     Effort: Pulmonary effort is normal.  Skin:    General: Skin is warm and dry.  Neurological:     Mental Status: She is alert.     GCS: GCS eye subscore is 4. GCS verbal subscore is 5. GCS motor subscore is 6.     Cranial Nerves: No cranial nerve deficit.     Sensory: Sensation is intact.     Motor: No weakness.     Coordination: Romberg sign negative. Finger-Nose-Finger Test normal.     Gait: Gait normal.     Comments:  No facial asymmetry no difficulty with word finding, following two-step commands no unilateral weakness present.  Psychiatric:        Mood and Affect: Mood normal.     ED Results / Procedures / Treatments   Labs (all labs ordered are listed, but only abnormal results are displayed) Labs Reviewed  PREGNANCY, URINE    EKG None  Radiology No results found.  Procedures Procedures    Medications Ordered in ED Medications  ibuprofen (ADVIL) tablet 600 mg (600 mg Oral Given 04/25/22 1021)  cyclobenzaprine (FLEXERIL) tablet 10 mg (10 mg Oral Given 04/25/22 1022)  metoCLOPramide (REGLAN) injection 10 mg (10 mg Intravenous Given 04/25/22 1022)  diphenhydrAMINE  (BENADRYL) capsule 25 mg (25 mg Oral Given 04/25/22 1022)  sodium chloride 0.9 % bolus 500 mL (500 mLs Intravenous New Bag/Given 04/25/22 1031)    ED Course/ Medical Decision Making/ A&P                           Medical Decision Making Amount and/or Complexity of Data Reviewed Labs: ordered.  Risk Prescription drug management.   This patient presents to the ED for concern of headache, this involves an extensive number of treatment options, and is a complaint that carries with it a high risk of complications and morbidity.  The differential diagnosis includes internal head bleed, and cranial mass, migraine, meningitis    Additional history obtained:  Additional history obtained from N/A External records from outside source obtained and reviewed including previous ED notes   Co morbidities that complicate the patient evaluation  N/A  Social Determinants of Health:  N/A    Lab Tests:  I Ordered, and personally interpreted labs.  The pertinent results include: N/A   Imaging Studies ordered:  I ordered imaging studies including N/A I independently visualized and interpreted imaging which showed N/A I agree with the radiologist interpretation   Cardiac Monitoring:  The patient was maintained on a cardiac monitor.  I personally viewed and interpreted the cardiac monitored which showed an underlying rhythm of: N/A   Medicines ordered and prescription drug management:  I ordered medication including migraine cocktail I have reviewed the patients home medicines and have made adjustments as needed  Critical Interventions:  N/A   Reevaluation:  Presents with a headache, benign physical exam, seems consistent with likely migraine/tension-like headache, will provide with a migraine cocktail and reassess.  Reassessed, states her headache has resolved, has no complaints, agreement plan discharge at this time.    Consultations Obtained:  N/A    Test  Considered:  CT head-defer to my suspicion for intracranial head bleed is low at this time not anticoag's no recent head trauma, she also had CT imaging performed 2 years ago which was negative for evidence of mass    Rule out low suspicion for internal head bleed and or mass as CT imaging is negative for acute findings.  Low suspicion for CVA she has no focal deficit present my exam.  Low suspicion for dissection of the vertebral or carotid artery as presentation atypical of etiology.  Low suspicion for meningitis as she has no meningeal sign present.     Dispostion and problem list  After consideration of the diagnostic results and the patients response to treatment, I feel that the patent would benefit from discharge.  Headache-since resolved, suspect this tension headache/migraine, recommend over-the-counter pain medications, follow-up with neurology/PCP for further assessment.  Final Clinical Impression(s) / ED Diagnoses Final diagnoses:  Bad headache    Rx / DC Orders ED Discharge Orders     None         Carroll Sage, PA-C 04/25/22 1140    Bethann Berkshire, MD 04/26/22 1729

## 2022-04-25 NOTE — Discharge Instructions (Signed)
Likely had a migraine, recommend over-the-counter pain medications like ibuprofen Tylenol, may use Excedrin, please stay hydrated, decrease in stress, get plenty of sleep this will help decrease exacerbation of headaches.  Follow-up with your PCP and/or neurology for further evaluation.  Come back to the emergency department if you develop chest pain, shortness of breath, severe abdominal pain, uncontrolled nausea, vomiting, diarrhea.

## 2022-12-13 ENCOUNTER — Encounter (HOSPITAL_COMMUNITY): Payer: Self-pay

## 2022-12-13 ENCOUNTER — Other Ambulatory Visit: Payer: Self-pay

## 2022-12-13 ENCOUNTER — Emergency Department (HOSPITAL_COMMUNITY)
Admission: EM | Admit: 2022-12-13 | Discharge: 2022-12-13 | Disposition: A | Discharge status: Home / Self Care | Payer: 59 | Attending: Emergency Medicine | Admitting: Emergency Medicine

## 2022-12-13 DIAGNOSIS — N939 Abnormal uterine and vaginal bleeding, unspecified: Secondary | ICD-10-CM | POA: Insufficient documentation

## 2022-12-13 DIAGNOSIS — R7309 Other abnormal glucose: Secondary | ICD-10-CM | POA: Diagnosis not present

## 2022-12-13 LAB — BASIC METABOLIC PANEL
Anion gap: 9 (ref 5–15)
BUN: 13 mg/dL (ref 6–20)
CO2: 24 mmol/L (ref 22–32)
Calcium: 8.7 mg/dL — ABNORMAL LOW (ref 8.9–10.3)
Chloride: 102 mmol/L (ref 98–111)
Creatinine, Ser: 0.72 mg/dL (ref 0.44–1.00)
GFR, Estimated: >60 mL/min (ref >60)
Glucose, Bld: 110 mg/dL — ABNORMAL HIGH (ref 70–99)
Potassium: 3.5 mmol/L (ref 3.5–5.1)
Sodium: 135 mmol/L (ref 135–145)

## 2022-12-13 LAB — CBC WITH DIFFERENTIAL/PLATELET
Abs Immature Granulocytes: 0.08 10*3/uL — ABNORMAL HIGH (ref 0.00–0.07)
Basophils Absolute: 0 10*3/uL (ref 0.0–0.1)
Basophils Relative: 0 %
Eosinophils Absolute: 0.2 10*3/uL (ref 0.0–0.5)
Eosinophils Relative: 2 %
HCT: 41.3 % (ref 36.0–46.0)
Hemoglobin: 13.5 g/dL (ref 12.0–15.0)
Immature Granulocytes: 1 %
Lymphocytes Relative: 27 %
Lymphs Abs: 2.3 10*3/uL (ref 0.7–4.0)
MCH: 29.2 pg (ref 26.0–34.0)
MCHC: 32.7 g/dL (ref 30.0–36.0)
MCV: 89.4 fL (ref 80.0–100.0)
Monocytes Absolute: 0.6 10*3/uL (ref 0.1–1.0)
Monocytes Relative: 7 %
Neutro Abs: 5.5 10*3/uL (ref 1.7–7.7)
Neutrophils Relative %: 63 %
Platelets: 327 10*3/uL (ref 150–400)
RBC: 4.62 MIL/uL (ref 3.87–5.11)
RDW: 13 % (ref 11.5–15.5)
WBC: 8.7 10*3/uL (ref 4.0–10.5)
nRBC: 0 % (ref 0.0–0.2)

## 2022-12-13 LAB — WET PREP, GENITAL
Clue Cells Wet Prep HPF POC: NONE SEEN
Trich, Wet Prep: NONE SEEN
WBC, Wet Prep HPF POC: NONE SEEN — AB (ref <10)
Yeast Wet Prep HPF POC: NONE SEEN

## 2022-12-13 LAB — HIV ANTIBODY (ROUTINE TESTING W REFLEX): HIV Screen 4th Generation wRfx: NONREACTIVE

## 2022-12-13 LAB — ABO/RH: ABO/RH(D): O POS

## 2022-12-13 LAB — RPR: RPR Ser Ql: NONREACTIVE

## 2022-12-13 LAB — HCG, QUANTITATIVE, PREGNANCY: hCG, Beta Chain, Quant, S: 1 m[IU]/mL (ref <5)

## 2022-12-13 NOTE — ED Provider Notes (Signed)
[facility] Provider Note   CSN: [id number] Arrival date & time: 12/13/22  [id number]     History  Chief Complaint  Patient presents with   Threatened Miscarriage    Krystal Porter is a 32 y.o. female.  The history is provided by the patient.  She has history of asthma, polycystic ovarian syndrome and comes in because of vaginal bleeding and concern for possible miscarriage.  Last menses was sometime in January but she is not sure since her menses are very irregular.  She had been having some urinary frequency and mild nausea but no breast swelling.  For the last 2 days, she had light bleeding which was not typical for her menses.  Bleeding stopped and she had intercourse tonight.  Following intercourse, she had recurrence of bleeding and passed a clot.  She has been having some abdominal cramping.  She has no known pregnancies.  She has been trying to get pregnant for 10 years and has been seeing a fertility specialist.   Home Medications Prior to Admission medications   Medication Sig Start Date End Date Taking? Authorizing Provider  albuterol (PROVENTIL HFA;VENTOLIN HFA) 108 (90 BASE) MCG/ACT inhaler Inhale into the lungs every 6 (six) hours as needed for wheezing or shortness of breath. Patient not taking: Reported on 04/25/2022    [provider]  dextromethorphan-guaiFENesin (MUCINEX DM) 30-600 MG 12hr tablet Take 1 tablet by mouth 2 (two) times daily. Patient not taking: Reported on 01/10/2020 10/27/19   Etta Quill, NP  ibuprofen (ADVIL) 600 MG tablet Take 1 tablet (600 mg total) by mouth 4 (four) times daily. Patient not taking: Reported on 10/27/2019 08/11/19   Lily Kocher, PA-C  pantoprazole (PROTONIX) 20 MG tablet Take 1 tablet (20 mg total) by mouth 2 (two) times daily. Patient not taking: Reported on 01/10/2020 11/13/18   Julianne Rice, MD      Allergies    Patient has no known allergies.    Review of Systems   Review  of Systems  All other systems reviewed and are negative.   Physical Exam Updated Vital Signs BP (!) 143/88   Pulse 89   Temp 98 F (36.7 C)   Resp 18   Wt 104.3 kg   SpO2 92%   BMI 40.74 kg/m  Physical Exam Vitals and nursing note reviewed.   32 year old female, resting comfortably and in no acute distress. Vital signs are significant for borderline elevated blood pressure. Oxygen saturation is 92%, which is normal. Head is normocephalic and atraumatic. PERRLA, EOMI. Oropharynx is clear. Neck is nontender and supple without adenopathy or JVD. Back is nontender and there is no CVA tenderness. Lungs are clear without rales, wheezes, or rhonchi. Chest is nontender. Heart has regular rate and rhythm without murmur. Abdomen is soft, flat, nontender. Pelvic: Normal external female genitalia.  Small amount of blood present in the vaginal vault with slight oozing of blood from the cervix.  No adnexal masses or tenderness.  Fundus difficult to evaluate because of body habitus but not grossly enlarged. Extremities have no cyanosis or edema, full range of motion is present. Skin is warm and dry without rash. Neurologic: Mental status is normal, cranial nerves are intact, moves all extremities equally.  ED Results / Procedures / Treatments   Labs (all labs ordered are listed, but only abnormal results are displayed) Labs Reviewed  WET PREP, GENITAL - Abnormal; Notable for the following components:  Result Value   WBC, Wet Prep HPF POC NONE SEEN (*)    All other components within normal limits  BASIC METABOLIC PANEL - Abnormal; Notable for the following components:   Glucose, Bld 110 (*)    Calcium 8.7 (*)    All other components within normal limits  CBC WITH DIFFERENTIAL/PLATELET - Abnormal; Notable for the following components:   Abs Immature Granulocytes 0.08 (*)    All other components within normal limits  HCG, QUANTITATIVE, PREGNANCY  RPR  HIV ANTIBODY (ROUTINE TESTING W  REFLEX)  ABO/RH  GC/CHLAMYDIA PROBE AMP ([place]) NOT AT [facility]   Procedures Procedures    Medications Ordered in ED Medications - No data to display  ED Course/ Medical Decision Making/ A&P                             Medical Decision Making Amount and/or Complexity of Data Reviewed Labs: ordered.   Vaginal bleeding which may be her regular menses.  If she is pregnant, consider threatened miscarriage, miscarriage, ectopic pregnancy.  I have reviewed and interpreted her laboratory tests and my interpretation is mildly elevated random glucose level which will need to be followed as an outpatient, hCG level of 1.  Patient is not pregnant and did not have a miscarriage.  I have informed her of this finding.  Apparently, bleeding is her normal menses.  I am discharging her with instructions to continue following up with her infertility specialist.  Final Clinical Impression(s) / ED Diagnoses Final diagnoses:  Abnormal vaginal bleeding  Elevated random blood glucose level    Rx / DC Orders ED Discharge Orders     None         Delora Fuel, MD [id number] [phone]

## 2022-12-13 NOTE — ED Triage Notes (Addendum)
Pt presents with possible miscarriage. Pt states that she been having bleeding the last couple days. States she had intercourse today and went to the bathroom complaining of pain and passed a big clot. Pt endorses extreme pain in abdominal area. Unsure if pregnant. States this is not like normal periods that she gets when she does have periods. Hx of PCOS

## 2022-12-13 NOTE — Discharge Instructions (Addendum)
Your test today show you are not pregnant.  The bleeding was apparently your normal menstrual bleeding.  Please continue to follow-up with your infertility specialist.  Return to the emergency department if you have any new or concerning symptoms.

## 2022-12-14 LAB — GC/CHLAMYDIA PROBE AMP (~~LOC~~) NOT AT ARMC
Chlamydia: NEGATIVE
Comment: NEGATIVE
Comment: NORMAL
Neisseria Gonorrhea: NEGATIVE
